# Patient Record
Sex: Female | Born: 1975 | Race: White | Hispanic: No | Marital: Single | Smoking: Former smoker
Health system: Southern US, Community
[De-identification: ages and names within clinical notes are randomized; demographics above are authoritative.]

## PROBLEM LIST (undated history)

## (undated) ENCOUNTER — Inpatient Hospital Stay (HOSPITAL_COMMUNITY): Payer: Self-pay

## (undated) DIAGNOSIS — L709 Acne, unspecified: Secondary | ICD-10-CM

## (undated) DIAGNOSIS — B069 Rubella without complication: Secondary | ICD-10-CM

## (undated) DIAGNOSIS — T7840XA Allergy, unspecified, initial encounter: Secondary | ICD-10-CM

## (undated) DIAGNOSIS — J329 Chronic sinusitis, unspecified: Secondary | ICD-10-CM

## (undated) HISTORY — PX: NO PAST SURGERIES: SHX2092

## (undated) HISTORY — PX: OTHER SURGICAL HISTORY: SHX169

## (undated) HISTORY — PX: WISDOM TOOTH EXTRACTION: SHX21

## (undated) HISTORY — PX: LEEP: SHX91

## (undated) HISTORY — DX: Rubella without complication: B06.9

## (undated) HISTORY — DX: Allergy, unspecified, initial encounter: T78.40XA

---

## 1997-12-03 ENCOUNTER — Observation Stay (HOSPITAL_COMMUNITY): Admission: AD | Admit: 1997-12-03 | Discharge: 1997-12-04 | Payer: Self-pay | Admitting: Obstetrics & Gynecology

## 1998-02-07 ENCOUNTER — Observation Stay (HOSPITAL_COMMUNITY): Admission: AD | Admit: 1998-02-07 | Discharge: 1998-02-08 | Payer: Self-pay | Admitting: Obstetrics & Gynecology

## 1998-03-01 ENCOUNTER — Inpatient Hospital Stay (HOSPITAL_COMMUNITY): Admission: AD | Admit: 1998-03-01 | Discharge: 1998-03-04 | Payer: Self-pay | Admitting: Obstetrics and Gynecology

## 1999-02-09 ENCOUNTER — Encounter: Admission: RE | Admit: 1999-02-09 | Discharge: 1999-05-10 | Payer: Self-pay

## 1999-05-11 ENCOUNTER — Encounter: Admission: RE | Admit: 1999-05-11 | Discharge: 1999-08-09 | Payer: Self-pay

## 1999-09-20 ENCOUNTER — Ambulatory Visit (HOSPITAL_COMMUNITY): Admission: RE | Admit: 1999-09-20 | Discharge: 1999-09-20 | Payer: Self-pay | Admitting: Neurosurgery

## 2000-12-31 ENCOUNTER — Other Ambulatory Visit: Admission: RE | Admit: 2000-12-31 | Discharge: 2000-12-31 | Payer: Self-pay | Admitting: Obstetrics and Gynecology

## 2001-07-29 ENCOUNTER — Inpatient Hospital Stay (HOSPITAL_COMMUNITY): Admission: AD | Admit: 2001-07-29 | Discharge: 2001-07-31 | Payer: Self-pay | Admitting: Obstetrics and Gynecology

## 2002-07-30 ENCOUNTER — Other Ambulatory Visit: Admission: RE | Admit: 2002-07-30 | Discharge: 2002-07-30 | Payer: Self-pay | Admitting: Obstetrics and Gynecology

## 2003-11-26 ENCOUNTER — Encounter: Admission: RE | Admit: 2003-11-26 | Discharge: 2003-11-26 | Payer: Self-pay | Admitting: Family Medicine

## 2003-11-26 ENCOUNTER — Ambulatory Visit (HOSPITAL_COMMUNITY): Admission: RE | Admit: 2003-11-26 | Discharge: 2003-11-26 | Payer: Self-pay | Admitting: Family Medicine

## 2012-01-24 ENCOUNTER — Encounter: Payer: Self-pay | Admitting: Obstetrics and Gynecology

## 2012-01-24 ENCOUNTER — Ambulatory Visit (INDEPENDENT_AMBULATORY_CARE_PROVIDER_SITE_OTHER): Payer: Self-pay | Admitting: Obstetrics and Gynecology

## 2012-01-24 VITALS — BP 92/64 | Temp 98.9°F | Wt 126.0 lb

## 2012-01-24 DIAGNOSIS — N871 Moderate cervical dysplasia: Secondary | ICD-10-CM | POA: Insufficient documentation

## 2012-01-24 DIAGNOSIS — N949 Unspecified condition associated with female genital organs and menstrual cycle: Secondary | ICD-10-CM

## 2012-01-24 DIAGNOSIS — R102 Pelvic and perineal pain: Secondary | ICD-10-CM

## 2012-01-24 DIAGNOSIS — Z309 Encounter for contraceptive management, unspecified: Secondary | ICD-10-CM

## 2012-01-24 DIAGNOSIS — Z30432 Encounter for removal of intrauterine contraceptive device: Secondary | ICD-10-CM

## 2012-01-24 DIAGNOSIS — F338 Other recurrent depressive disorders: Secondary | ICD-10-CM | POA: Insufficient documentation

## 2012-01-24 DIAGNOSIS — G43909 Migraine, unspecified, not intractable, without status migrainosus: Secondary | ICD-10-CM | POA: Insufficient documentation

## 2012-01-24 LAB — POCT URINALYSIS DIPSTICK
Bilirubin, UA: NEGATIVE
Glucose, UA: NEGATIVE
Ketones, UA: NEGATIVE
Nitrite, UA: NEGATIVE
Spec Grav, UA: >=1.03
Urobilinogen, UA: NEGATIVE
pH, UA: 5

## 2012-01-24 LAB — CBC
HCT: 43.2 % (ref 36.0–46.0)
Hemoglobin: 14.4 g/dL (ref 12.0–15.0)
MCH: 31 pg (ref 26.0–34.0)
MCHC: 33.3 g/dL (ref 30.0–36.0)
RDW: 13.4 % (ref 11.5–15.5)

## 2012-01-24 LAB — COMPREHENSIVE METABOLIC PANEL
AST: 16 U/L (ref 0–37)
Alkaline Phosphatase: 37 U/L — ABNORMAL LOW (ref 39–117)
BUN: 13 mg/dL (ref 6–23)
Creat: 0.67 mg/dL (ref 0.50–1.10)
Glucose, Bld: 86 mg/dL (ref 70–99)
Potassium: 4.7 mEq/L (ref 3.5–5.3)
Total Bilirubin: 0.5 mg/dL (ref 0.3–1.2)

## 2012-01-24 LAB — POCT URINE PREGNANCY: Preg Test, Ur: NEGATIVE

## 2012-01-24 NOTE — Patient Instructions (Signed)
You will be given a note to return to work once your ultrasound has been done and results reviewed Please keep your ultrasound appointment as scheduled.  If you cannot keep it, please call and reschedule.  If your pain does not improve with the pain medication given, or if your symptoms worsen, please call the office.

## 2012-01-24 NOTE — Progress Notes (Signed)
Patient with constant intense lower back pain radiating through to pelvic area since yesterday morning.  No relief with Ibuprofen 800mg  but decreased from 10/10 to 2/10 with Vicodin x 1 given by fiance'. Normal BM & urination, denies fever, dyspareunia, N/V but has begun to bleed (wipes blood).  Typically only has one spot monthly with Mirena. Denies heavy lifting, h/o renal stones, previous surgery on abdomen or trauma. Pt. does notice that pain is worse with sitting.  Lastly, patient wants Mirena removed.  O: Abdomen: soft, BS+, tender with guarding in RLQ more than LLQ but no rebound.  Pelvic:  EGBUS-wnl, vagina with moderate blood, cervix is non-tender without lesions with visible IUD strings-IUD removed without difficulty., uterus tender, normal size; adnexae tender right greater than left without any obvious palpable masses.  neg UPT & Chem 9  A: Pelvic Pain     Mirena Removal     Need for contraception  P: Pelvic ultrasound R/O pelvic masses      CBC, CMET-pending      Vicodin #30 1-2 q4 hours prn mod-severe pain      Pt. considering BCPs but smokes, so will accept          Micronor #1 po qd 11 rf; advised to use condoms x 1 month before depending on pills for contraception. Call office if symptoms worsen or don't respond to Vicodin

## 2012-01-25 ENCOUNTER — Other Ambulatory Visit: Payer: Self-pay | Admitting: Obstetrics and Gynecology

## 2012-01-25 ENCOUNTER — Encounter: Payer: Self-pay | Admitting: Obstetrics and Gynecology

## 2012-01-25 ENCOUNTER — Ambulatory Visit (INDEPENDENT_AMBULATORY_CARE_PROVIDER_SITE_OTHER): Payer: Self-pay | Admitting: Obstetrics and Gynecology

## 2012-01-25 ENCOUNTER — Ambulatory Visit (INDEPENDENT_AMBULATORY_CARE_PROVIDER_SITE_OTHER): Payer: Self-pay

## 2012-01-25 VITALS — BP 90/58 | HR 60 | Temp 98.2°F

## 2012-01-25 DIAGNOSIS — N949 Unspecified condition associated with female genital organs and menstrual cycle: Secondary | ICD-10-CM

## 2012-01-25 DIAGNOSIS — R102 Pelvic and perineal pain: Secondary | ICD-10-CM

## 2012-01-25 DIAGNOSIS — N83209 Unspecified ovarian cyst, unspecified side: Secondary | ICD-10-CM

## 2012-01-25 NOTE — Progress Notes (Signed)
Patient returned for follow up of pelvic pain with an ultrasound.  Patient continues to have pain.  Only takes one Vicodin which only takes the edge off.   Patient advised to take two tablets and may alternate with Ibuprofen 600 mg with food.  O:  U/S- right ovarian simple cyst 5.4 x 3.9 x 4.8 cm       uterus- 7.17 x 5.60 x 4.61 cm, left ovary-wnl  A: Right ovarian simple cyst  P:Torsion precautions     repeat ultrasound in 6 weeks     Continue Vicodin & Ibuprfen

## 2012-01-25 NOTE — Patient Instructions (Signed)
Call for sudden pain that does not improve quickly or temperature above 100 degrees

## 2012-04-17 ENCOUNTER — Encounter (HOSPITAL_BASED_OUTPATIENT_CLINIC_OR_DEPARTMENT_OTHER): Payer: Self-pay | Admitting: *Deleted

## 2012-04-17 ENCOUNTER — Emergency Department (HOSPITAL_BASED_OUTPATIENT_CLINIC_OR_DEPARTMENT_OTHER)
Admission: EM | Admit: 2012-04-17 | Discharge: 2012-04-17 | Disposition: A | Discharge status: Home / Self Care | Payer: Self-pay | Attending: Emergency Medicine | Admitting: Emergency Medicine

## 2012-04-17 DIAGNOSIS — F17200 Nicotine dependence, unspecified, uncomplicated: Secondary | ICD-10-CM | POA: Insufficient documentation

## 2012-04-17 DIAGNOSIS — Z882 Allergy status to sulfonamides status: Secondary | ICD-10-CM | POA: Insufficient documentation

## 2012-04-17 DIAGNOSIS — L255 Unspecified contact dermatitis due to plants, except food: Secondary | ICD-10-CM | POA: Insufficient documentation

## 2012-04-17 DIAGNOSIS — L237 Allergic contact dermatitis due to plants, except food: Secondary | ICD-10-CM

## 2012-04-17 DIAGNOSIS — T622X1A Toxic effect of other ingested (parts of) plant(s), accidental (unintentional), initial encounter: Secondary | ICD-10-CM | POA: Insufficient documentation

## 2012-04-17 MED ORDER — METHYLPREDNISOLONE SODIUM SUCC 125 MG IJ SOLR
125.0000 mg | Freq: Once | INTRAMUSCULAR | Status: AC
  Administered 2012-04-17: 125 mg via INTRAVENOUS
  Filled 2012-04-17: qty 2

## 2012-04-17 MED ORDER — PREDNISONE 10 MG PO TABS: ORAL_TABLET | ORAL | Status: DC

## 2012-04-17 NOTE — ED Provider Notes (Signed)
Medical screening examination/treatment/procedure(s) were performed by non-physician practitioner and as supervising physician I was immediately available for consultation/collaboration.   Leigh-Ann Lorn Butcher, MD 04/17/12 2349 

## 2012-04-17 NOTE — ED Provider Notes (Signed)
History     CSN: [id number]  Arrival date & time 04/17/12  7829   First MD Initiated Contact with Patient 04/17/12 1903      Chief Complaint  Patient presents with  . Rash    (Consider location/radiation/quality/duration/timing/severity/associated sxs/prior treatment) Patient is a 36 y.o. female presenting with rash. The history is provided by the patient. No language interpreter was used.  Rash  This is a new problem. The current episode started more than 2 days ago. The problem has been gradually worsening. The problem is associated with plant contact. There has been no fever. The pain is at a severity of 4/10. The pain is moderate. The pain has been constant since onset. Associated symptoms include itching. She has tried anti-itch cream and antihistamines for the symptoms.  Pt cleanedout brush and weeds now has a rash.  Pt here with daughter who has the same  Past Medical History  Diagnosis Date  . Allergy     Past Surgical History  Procedure Date  . Wisdom tooth extraction     No family history on file.  History  Substance Use Topics  . Smoking status: Current Everyday Smoker -- 10.0 packs/day    Types: Cigarettes  . Smokeless tobacco: Not on file   Comment: PT SMOKES 10 CIG. PER DAY  . Alcohol Use: No     DO NOT CURRENTLY DRINK    OB History    Grav Para Term Preterm Abortions TAB SAB Ect Mult Living                  Review of Systems  Skin: Positive for itching and rash.  All other systems reviewed and are negative.    Allergies  Sulfa antibiotics  Home Medications   Current Outpatient Rx  Name Route Sig Dispense Refill  . GOODY HEADACHE PO Oral Take 1 packet by mouth every 6 (six) hours as needed. For headache    . B COMPLEX PO TABS Oral Take 1 tablet by mouth daily.    . NORETHINDRONE 0.35 MG PO TABS Oral Take 1 tablet by mouth daily.    . TECNU OAK-N-IVY SKIN CLEANSER EX Apply externally Apply 1 application topically once as needed. For rash      . PREDNISONE 10 MG PO TABS  6,6,5,5,4,4,3,3,2,2,1,1 42 tablet 0    BP 99/67  Pulse 69  Temp 98.5 F (36.9 C) (Oral)  Resp 18  Ht 5' 5.5" (1.664 m)  Wt 124 lb (56.246 kg)  BMI 20.32 kg/m2  SpO2 100%  Physical Exam  Nursing note and vitals reviewed. Constitutional: She is oriented to person, place, and time. She appears well-developed and well-nourished.  HENT:  Head: Normocephalic.  Eyes: Pupils are equal, round, and reactive to light.  Cardiovascular: Normal rate.   Pulmonary/Chest: Effort normal.  Musculoskeletal: Normal range of motion.       Rash arms and neck  Neurological: She is alert and oriented to person, place, and time. She has normal reflexes.  Skin: Rash noted. There is erythema.  Psychiatric: She has a normal mood and affect.    ED Course  Procedures (including critical care time)  Labs Reviewed - No data to display No results found.   1. Poison ivy       MDM  Pt wants injection.  I gave solumedrol.  I will treat with steroid taper.          Lonia Skinner [place], [place] 04/17/12 1952

## 2012-04-17 NOTE — ED Notes (Signed)
Rash x 3 days after doing yard work.

## 2012-10-15 NOTE — L&D Delivery Note (Signed)
Delivery Note At 3:33 AM a viable female was a waterbirth delivered via Vaginal, Spontaneous Delivery (Presentation: LOA; nuchal X 1  ).  APGAR: 8 , 9 ; weight pending .   Placenta status: Intact, Spontaneous.  Cord: nuchal X1  with the following complications: none  Anesthesia:  none Episiotomy: none Lacerations: none Suture Repair: none Est. Blood Loss (mL):   Mom to postpartum.  Baby to nursery-stable.  [name] 07/15/2013, 4:03 AM

## 2012-10-24 ENCOUNTER — Encounter (HOSPITAL_BASED_OUTPATIENT_CLINIC_OR_DEPARTMENT_OTHER): Payer: Self-pay | Admitting: *Deleted

## 2012-10-24 ENCOUNTER — Emergency Department (HOSPITAL_BASED_OUTPATIENT_CLINIC_OR_DEPARTMENT_OTHER): Payer: Medicaid Other

## 2012-10-24 ENCOUNTER — Emergency Department (HOSPITAL_BASED_OUTPATIENT_CLINIC_OR_DEPARTMENT_OTHER)
Admission: EM | Admit: 2012-10-24 | Discharge: 2012-10-24 | Disposition: A | Discharge status: Home / Self Care | Payer: Medicaid Other | Attending: Emergency Medicine | Admitting: Emergency Medicine

## 2012-10-24 DIAGNOSIS — J3489 Other specified disorders of nose and nasal sinuses: Secondary | ICD-10-CM | POA: Insufficient documentation

## 2012-10-24 DIAGNOSIS — R0789 Other chest pain: Secondary | ICD-10-CM | POA: Insufficient documentation

## 2012-10-24 DIAGNOSIS — L0201 Cutaneous abscess of face: Secondary | ICD-10-CM | POA: Insufficient documentation

## 2012-10-24 DIAGNOSIS — L03211 Cellulitis of face: Secondary | ICD-10-CM | POA: Insufficient documentation

## 2012-10-24 DIAGNOSIS — R6883 Chills (without fever): Secondary | ICD-10-CM | POA: Insufficient documentation

## 2012-10-24 DIAGNOSIS — J40 Bronchitis, not specified as acute or chronic: Secondary | ICD-10-CM

## 2012-10-24 DIAGNOSIS — F17200 Nicotine dependence, unspecified, uncomplicated: Secondary | ICD-10-CM | POA: Insufficient documentation

## 2012-10-24 DIAGNOSIS — Z3202 Encounter for pregnancy test, result negative: Secondary | ICD-10-CM | POA: Insufficient documentation

## 2012-10-24 MED ORDER — AZITHROMYCIN 250 MG PO TABS: 250.0000 mg | ORAL_TABLET | Freq: Every day | ORAL | Status: DC

## 2012-10-24 NOTE — ED Notes (Signed)
Pt states she has been sick with fever chills cough congestion and body aches for 15 days will get better then start going downhill again no nausea vomiting or diarrhea

## 2012-10-24 NOTE — ED Provider Notes (Signed)
History     CSN: [id number]  Arrival date & time 10/24/12  1218   First MD Initiated Contact with Patient 10/24/12 1230      Chief Complaint  Patient presents with  . Cough  . Chills    (Consider location/radiation/quality/duration/timing/severity/associated sxs/prior treatment) Patient is a 37 y.o. female presenting with cough. The history is provided by the patient.  Cough This is a new problem. Episode onset: 15 days ago. The problem occurs constantly. The problem has not changed since onset.The cough is productive of sputum. There has been no fever. Associated symptoms include chest pain, chills, ear congestion, rhinorrhea and myalgias. Pertinent negatives include no shortness of breath and no wheezing. She has tried decongestants for the symptoms. The treatment provided no relief. She is a smoker. Her past medical history does not include bronchitis, COPD or asthma.    Past Medical History  Diagnosis Date  . Allergy     Past Surgical History  Procedure Date  . Wisdom tooth extraction     History reviewed. No pertinent family history.  History  Substance Use Topics  . Smoking status: Current Every Day Smoker -- 10.0 packs/day    Types: Cigarettes  . Smokeless tobacco: Not on file     Comment: PT SMOKES 10 CIG. PER DAY  . Alcohol Use: No     Comment: DO NOT CURRENTLY DRINK    OB History    Grav Para Term Preterm Abortions TAB SAB Ect Mult Living                  Review of Systems  Constitutional: Positive for chills.  HENT: Positive for rhinorrhea.   Respiratory: Positive for cough. Negative for shortness of breath and wheezing.   Cardiovascular: Positive for chest pain.  Musculoskeletal: Positive for myalgias.  Skin:       Also new painful, swollen pimple on the left chin that is getting worse over the last few days  All other systems reviewed and are negative.    Allergies  Sulfa antibiotics  Home Medications   Current Outpatient Rx  Name  Route   Sig  Dispense  Refill  . GOODY HEADACHE PO   Oral   Take 1 packet by mouth every 6 (six) hours as needed. For headache         . B COMPLEX PO TABS   Oral   Take 1 tablet by mouth daily.         . NORETHINDRONE 0.35 MG PO TABS   Oral   Take 1 tablet by mouth daily.         . TECNU OAK-N-IVY SKIN CLEANSER EX   Apply externally   Apply 1 application topically once as needed. For rash         . PREDNISONE 10 MG PO TABS      6,6,5,5,4,4,3,3,2,2,1,1   42 tablet   0     BP 106/63  Pulse 76  Temp 98.7 F (37.1 C) (Oral)  Resp 20  SpO2 99%  LMP 10/10/2012  Physical Exam  Nursing note and vitals reviewed. Constitutional: She is oriented to person, place, and time. She appears well-developed and well-nourished. No distress.  HENT:  Head: Normocephalic and atraumatic.    Nose: Mucosal edema and rhinorrhea present. Right sinus exhibits no maxillary sinus tenderness and no frontal sinus tenderness. Left sinus exhibits no maxillary sinus tenderness and no frontal sinus tenderness.  Mouth/Throat: Oropharynx is clear and moist and mucous membranes are normal.  Eyes: Conjunctivae normal and EOM are normal. Pupils are equal, round, and reactive to light.  Neck: Normal range of motion. Neck supple.  Cardiovascular: Normal rate, regular rhythm and intact distal pulses.   No murmur heard. Pulmonary/Chest: Effort normal and breath sounds normal. No respiratory distress. She has no wheezes. She has no rales.  Abdominal: Soft. She exhibits no distension. There is no tenderness. There is no rebound and no guarding.  Musculoskeletal: Normal range of motion. She exhibits no edema and no tenderness.  Neurological: She is alert and oriented to person, place, and time.  Skin: Skin is warm and dry. No rash noted.  Psychiatric: She has a normal mood and affect. Her behavior is normal.    ED Course  Procedures (including critical care time)   Labs Reviewed  PREGNANCY, URINE   No  results found.  INCISION AND DRAINAGE Performed by: Gwyneth Sprout Consent: Verbal consent obtained. Risks and benefits: risks, benefits and alternatives were discussed Type: abscess  Body area:  left chin  Anesthesia: local infiltration  Incision was made with a 18-gauge  Local anesthetic: lidocaine 1 % with epinephrine  Anesthetic total: 1 ml  Complexity: Simple   Drainage: purulent  Drainage amount: 1mL Packing material: None Patient tolerance: Patient tolerated the procedure well with no immediate complications.    1. Bronchitis   2. Facial abscess       MDM   Pt with symptoms consistent with viral URI and bronchitis like sx for the last 15 days without improvement.  Well appearing here.  No signs of breathing difficulty  No signs of pharyngitis, otitis or abnormal abdominal findings.   CXR wnl and pt to return with any further problems and will start azithromycin.  Secondly pt has abscess on her left chin that was I&D without surrounding cellulitis.         Gwyneth Sprout, MD 10/24/12 1344

## 2012-11-16 ENCOUNTER — Emergency Department (HOSPITAL_BASED_OUTPATIENT_CLINIC_OR_DEPARTMENT_OTHER)
Admission: EM | Admit: 2012-11-16 | Discharge: 2012-11-16 | Disposition: A | Discharge status: Home / Self Care | Payer: Medicaid Other | Attending: Emergency Medicine | Admitting: Emergency Medicine

## 2012-11-16 ENCOUNTER — Inpatient Hospital Stay (HOSPITAL_COMMUNITY): Payer: Medicaid Other

## 2012-11-16 ENCOUNTER — Inpatient Hospital Stay (EMERGENCY_DEPARTMENT_HOSPITAL)
Admission: AD | Admit: 2012-11-16 | Discharge: 2012-11-16 | Disposition: A | Discharge status: Home / Self Care | Payer: Medicaid Other | Source: Ambulatory Visit | Attending: Obstetrics & Gynecology | Admitting: Obstetrics & Gynecology

## 2012-11-16 ENCOUNTER — Encounter (HOSPITAL_COMMUNITY): Payer: Self-pay | Admitting: *Deleted

## 2012-11-16 ENCOUNTER — Encounter (HOSPITAL_BASED_OUTPATIENT_CLINIC_OR_DEPARTMENT_OTHER): Payer: Self-pay | Admitting: *Deleted

## 2012-11-16 DIAGNOSIS — R630 Anorexia: Secondary | ICD-10-CM | POA: Insufficient documentation

## 2012-11-16 DIAGNOSIS — R110 Nausea: Secondary | ICD-10-CM | POA: Insufficient documentation

## 2012-11-16 DIAGNOSIS — R1011 Right upper quadrant pain: Secondary | ICD-10-CM | POA: Insufficient documentation

## 2012-11-16 DIAGNOSIS — K5900 Constipation, unspecified: Secondary | ICD-10-CM | POA: Insufficient documentation

## 2012-11-16 DIAGNOSIS — R109 Unspecified abdominal pain: Secondary | ICD-10-CM

## 2012-11-16 DIAGNOSIS — Z3201 Encounter for pregnancy test, result positive: Secondary | ICD-10-CM | POA: Insufficient documentation

## 2012-11-16 DIAGNOSIS — O26899 Other specified pregnancy related conditions, unspecified trimester: Secondary | ICD-10-CM

## 2012-11-16 DIAGNOSIS — F17200 Nicotine dependence, unspecified, uncomplicated: Secondary | ICD-10-CM | POA: Insufficient documentation

## 2012-11-16 DIAGNOSIS — O99891 Other specified diseases and conditions complicating pregnancy: Secondary | ICD-10-CM | POA: Insufficient documentation

## 2012-11-16 DIAGNOSIS — Z8709 Personal history of other diseases of the respiratory system: Secondary | ICD-10-CM | POA: Insufficient documentation

## 2012-11-16 DIAGNOSIS — IMO0002 Reserved for concepts with insufficient information to code with codable children: Secondary | ICD-10-CM | POA: Insufficient documentation

## 2012-11-16 DIAGNOSIS — R197 Diarrhea, unspecified: Secondary | ICD-10-CM | POA: Insufficient documentation

## 2012-11-16 DIAGNOSIS — R3915 Urgency of urination: Secondary | ICD-10-CM | POA: Insufficient documentation

## 2012-11-16 LAB — COMPREHENSIVE METABOLIC PANEL
AST: 21 U/L (ref 0–37)
BUN: 11 mg/dL (ref 6–23)
CO2: 22 mEq/L (ref 19–32)
Chloride: 102 mEq/L (ref 96–112)
Creatinine, Ser: 0.6 mg/dL (ref 0.50–1.10)
GFR calc non Af Amer: >90 mL/min (ref >90)
Total Bilirubin: 0.2 mg/dL — ABNORMAL LOW (ref 0.3–1.2)

## 2012-11-16 LAB — URINALYSIS, ROUTINE W REFLEX MICROSCOPIC
Bilirubin Urine: NEGATIVE
Hgb urine dipstick: NEGATIVE
Specific Gravity, Urine: 1.025 (ref 1.005–1.030)
pH: 6 (ref 5.0–8.0)

## 2012-11-16 LAB — CBC WITH DIFFERENTIAL/PLATELET
Eosinophils Relative: 2 % (ref 0–5)
HCT: 39 % (ref 36.0–46.0)
Lymphocytes Relative: 27 % (ref 12–46)
Lymphs Abs: 2 10*3/uL (ref 0.7–4.0)
MCV: 89 fL (ref 78.0–100.0)
Monocytes Absolute: 0.6 10*3/uL (ref 0.1–1.0)
RBC: 4.38 MIL/uL (ref 3.87–5.11)
WBC: 7.4 10*3/uL (ref 4.0–10.5)

## 2012-11-16 LAB — HCG, QUANTITATIVE, PREGNANCY: hCG, Beta Chain, Quant, S: 15106 m[IU]/mL — ABNORMAL HIGH (ref <5)

## 2012-11-16 MED ORDER — ONDANSETRON HCL 4 MG/2ML IJ SOLN
4.0000 mg | Freq: Once | INTRAMUSCULAR | Status: AC
  Administered 2012-11-16: 4 mg via INTRAVENOUS
  Filled 2012-11-16: qty 2

## 2012-11-16 MED ORDER — SODIUM CHLORIDE 0.9 % IV BOLUS (SEPSIS)
1000.0000 mL | Freq: Once | INTRAVENOUS | Status: AC
  Administered 2012-11-16: 1000 mL via INTRAVENOUS

## 2012-11-16 NOTE — MAU Note (Signed)
Thressa Sheller CNM in Triage to discuss u/s results and d/c plan with pt. Written and verbal d/c instructions given and understanding voiced.

## 2012-11-16 NOTE — MAU Note (Signed)
Pt came over from [facility]. Positive upt and had quant. Here for u/s.

## 2012-11-16 NOTE — ED Notes (Signed)
C/o mid abd pain that started one week ago but worse today. Describes as cramping type pain that is constant. Last BM was 2-1. C/o nausea and denies vomiting. Denies fevers. C/o urinary frequency.

## 2012-11-16 NOTE — MAU Provider Note (Signed)
  History     CSN: [id number]  Arrival date and time: 11/16/12 0305   None     Chief Complaint  Patient presents with  . Abdominal Pain   HPI  Pt was sent over from [facility] for ultrasound. She was seen there, and 2/2 RUQ and abdominal pain. She had a BHCG 15,000+. Unable to doppler FHT, and patient was sent here for [place] evaluation.   Past Medical History  Diagnosis Date  . Allergy     Past Surgical History  Procedure Date  . Wisdom tooth extraction     No family history on file.  History  Substance Use Topics  . Smoking status: Current Every Day Smoker -- 10.0 packs/day    Types: Cigarettes  . Smokeless tobacco: Not on file     Comment: PT SMOKES 10 CIG. PER DAY  . Alcohol Use: Yes     Comment: social     Allergies:  Allergies  Allergen Reactions  . Sulfa Antibiotics Other (See Comments)    headache    Prescriptions prior to admission  Medication Sig Dispense Refill  . Aspirin-Acetaminophen-Caffeine (GOODY HEADACHE PO) Take 1 packet by mouth every 6 (six) hours as needed. For headache      . b complex vitamins tablet Take 1 tablet by mouth daily.      . norethindrone (MICRONOR,CAMILA,ERRIN) 0.35 MG tablet Take 1 tablet by mouth daily.      . Poison Ivy Treatments (TECNU OAK-N-IVY SKIN CLEANSER EX) Apply 1 application topically once as needed. For rash        ROS Physical Exam   Blood pressure 97/58, pulse 81, temperature 98.7 F (37.1 C), resp. rate 20, height 5' 5.5" (1.664 m), weight 142 lb 3.2 oz (64.501 kg), last menstrual period 10/10/2012.  Physical Exam  MAU Course  Procedures  *RADIOLOGY REPORT*  Clinical Data: Pelvic pain. Quantitative beta HCG 15,106. Last  menstrual period is 10/10/2012.  OBSTETRIC <14 WK [place] AND TRANSVAGINAL OB US  Technique: Both transabdominal and transvaginal ultrasound  examinations were performed for complete evaluation of the  gestation as well as the maternal uterus, adnexal regions, and  pelvic  cul-de-sac. Transvaginal technique was performed to assess  early pregnancy.  Comparison: None.  Intrauterine gestational sac: A single intrauterine gestational  sac is visualized with regular contours.  Yolk sac: The yolk sac is visualized.  Embryo: The fetal pole is visualized.  Cardiac Activity: Fetal cardiac activity is demonstrated.  Heart Rate: 103 bpm  MSD: 1.51 cm 6 w 3 d [place] EDC: 07/09/2013  CRL: 1.7 mm, too small to register for estimated gestational age.  Maternal uterus/adnexae:  No myometrial mass or subchorionic hemorrhage identified. The  right ovary measures 4.1 x 1.6 x 2.5 cm. Normal follicular  changes. The left ovary measures 5 x 3.1 x 2.9 cm. Involuting  corpus luteum cyst is present. Flow is demonstrated in both  ovaries on color flow Doppler imaging. No free pelvic fluid  collections.  IMPRESSION:  Single intrauterine pregnancy with estimated gestational age by  mean sac diameter of 6 weeks 3 days.  Original Report Authenticated By: Burman Nieves, M.D.  Assessment and Plan   1. Abdominal pain in pregnancy    First trimester danger signs reviewed Start prenatal care as soon as possible  [name] 11/16/2012, 3:36 AM

## 2012-11-16 NOTE — ED Provider Notes (Addendum)
History     CSN: [id number]  Arrival date & time 11/16/12  0005   First MD Initiated Contact with Patient 11/16/12 0021      Chief Complaint  Patient presents with  . Abdominal Pain    (Consider location/radiation/quality/duration/timing/severity/associated sxs/prior treatment) HPI Pt reports about a week of cramping, gnawing abdominal pain, comes and goes, associated with nausea and decreased appetite. She has had irregular bowels with bouts of constipation and diarrhea. Denies any fever, no blood in stool. Pain is mostly in epigastric and RUQ. No prior history of same. No dysuria, but some urinary urgency. No vaginal bleeding or discharge.    Past Medical History  Diagnosis Date  . Allergy     Past Surgical History  Procedure Date  . Wisdom tooth extraction     No family history on file.  History  Substance Use Topics  . Smoking status: Current Every Day Smoker -- 10.0 packs/day    Types: Cigarettes  . Smokeless tobacco: Not on file     Comment: PT SMOKES 10 CIG. PER DAY  . Alcohol Use: Yes     Comment: social     OB History    Grav Para Term Preterm Abortions TAB SAB Ect Mult Living                  Review of Systems All other systems reviewed and are negative except as noted in HPI.   Allergies  Sulfa antibiotics  Home Medications   Current Outpatient Rx  Name  Route  Sig  Dispense  Refill  . GOODY HEADACHE PO   Oral   Take 1 packet by mouth every 6 (six) hours as needed. For headache         . AZITHROMYCIN 250 MG PO TABS   Oral   Take 1 tablet (250 mg total) by mouth daily. Take first 2 tablets together, then 1 every day until finished.   6 tablet   0   . B COMPLEX PO TABS   Oral   Take 1 tablet by mouth daily.         . NORETHINDRONE 0.35 MG PO TABS   Oral   Take 1 tablet by mouth daily.         . TECNU OAK-N-IVY SKIN CLEANSER EX   Apply externally   Apply 1 application topically once as needed. For rash         . PREDNISONE  10 MG PO TABS      6,6,5,5,4,4,3,3,2,2,1,1   42 tablet   0     BP 104/71  Pulse 72  Temp 98.2 F (36.8 C) (Oral)  Resp 18  Ht 5' 5.5" (1.664 m)  Wt 135 lb (61.236 kg)  BMI 22.12 kg/m2  SpO2 100%  LMP 10/10/2012  Physical Exam  Nursing note and vitals reviewed. Constitutional: She is oriented to person, place, and time. She appears well-developed and well-nourished.  HENT:  Head: Normocephalic and atraumatic.  Eyes: EOM are normal. Pupils are equal, round, and reactive to light.  Neck: Normal range of motion. Neck supple.  Cardiovascular: Normal rate, normal heart sounds and intact distal pulses.   Pulmonary/Chest: Effort normal and breath sounds normal.  Abdominal: Bowel sounds are normal. She exhibits no distension. There is tenderness (RUQ, mild). There is no rebound and no guarding.  Musculoskeletal: Normal range of motion. She exhibits no edema and no tenderness.  Neurological: She is alert and oriented to person, place, and time. She has  normal strength. No cranial nerve deficit or sensory deficit.  Skin: Skin is warm and dry. No rash noted.  Psychiatric: She has a normal mood and affect.    ED Course  Procedures (including critical care time)  Labs Reviewed  PREGNANCY, URINE - Abnormal; Notable for the following:    Preg Test, Ur POSITIVE (*)     All other components within normal limits  COMPREHENSIVE METABOLIC PANEL - Abnormal; Notable for the following:    Alkaline Phosphatase 34 (*)     Total Bilirubin 0.2 (*)     All other components within normal limits  HCG, QUANTITATIVE, PREGNANCY - Abnormal; Notable for the following:    hCG, Beta Chain, Quant, S 15106 (*)     All other components within normal limits  URINALYSIS, ROUTINE W REFLEX MICROSCOPIC  CBC WITH DIFFERENTIAL  LIPASE, BLOOD   No results found.   No diagnosis found.    MDM  Urine HCG is positive. Pt states has been on mini-pill. She was here for bronchitis about 3 weeks ago and hcg neg  at that time. LMP was end of December. Will confirm with serum quant. No pelvic pain or tenderness and no vaginal complaints. Will also eval gall bladder.   2:15 AM Quant confirms pregnancy. No ultrasound available here at this hour. Advised to go directly to MAU for further eval and to rule out ectopic. Pt agrees and will go there directly from here. She is safe to drive herself. Daughter is here with her as well.    Spoke with Herbert Seta, midwife at [facility]U to let her know the patient will be coming there. She asked that I check FHT given relatively high quant for reported gestational age. I was unable to find any fetal heart tones.   Algie Westry B. Bernette Mayers, MD 11/16/12 [phone]

## 2012-11-19 ENCOUNTER — Encounter (HOSPITAL_COMMUNITY): Payer: Self-pay

## 2012-11-19 ENCOUNTER — Inpatient Hospital Stay (HOSPITAL_COMMUNITY)
Admission: AD | Admit: 2012-11-19 | Discharge: 2012-11-19 | Disposition: A | Discharge status: Home / Self Care | Payer: Medicaid Other | Source: Ambulatory Visit | Attending: Obstetrics and Gynecology | Admitting: Obstetrics and Gynecology

## 2012-11-19 DIAGNOSIS — O209 Hemorrhage in early pregnancy, unspecified: Secondary | ICD-10-CM

## 2012-11-19 LAB — WET PREP, GENITAL: Yeast Wet Prep HPF POC: NONE SEEN

## 2012-11-19 NOTE — MAU Note (Signed)
Just found out preg last Friday, has been having pain.  Bleeding started this morning, ? Passed something- not a blood clot. Has been watery.

## 2012-11-19 NOTE — MAU Provider Note (Signed)
History     CSN: [id number]  Arrival date and time: 11/19/12 1233   First Provider Initiated Contact with Patient 11/19/12 1322      Chief Complaint  Patient presents with  . Vaginal Bleeding   HPI Ms. Virginia Sawyer is a 37 y.o. [id number] at [redacted]w[redacted]d who presents to MAU today with complaint of vaginal bleeding. The patient states that this started earlier today. She states that it seems to be thin and watery with small clots. She is having mild lower abdominal cramping similar to when she was evaluated previously. The patient has had some nausea without vomiting. She is fatigued, but has no fever. She also states that she had intercourse yesterday in the morning. She has had issues with alternating constipation and diarrhea x 1 month which has also caused her to have some lower abdominal cramping.   OB History    Grav Para Term Preterm Abortions TAB SAB Ect Mult Living   5 3 3  1  1   3       Past Medical History  Diagnosis Date  . Allergy     Past Surgical History  Procedure Date  . Wisdom tooth extraction   . No past surgeries   . Lasix eye surgery     age 44    No family history on file.  History  Substance Use Topics  . Smoking status: Current Every Day Smoker -- 10.0 packs/day    Types: Cigarettes  . Smokeless tobacco: Not on file     Comment: PT SMOKES 10 CIG. PER DAY  . Alcohol Use: Yes     Comment: social     Allergies:  Allergies  Allergen Reactions  . Sulfa Antibiotics Other (See Comments)    headache    Prescriptions prior to admission  Medication Sig Dispense Refill  . b complex vitamins tablet Take 1 tablet by mouth daily.      . minocycline (DYNACIN) 100 MG tablet Take 100 mg by mouth 2 (two) times daily.        Review of Systems  Constitutional: Negative for fever and chills.  Gastrointestinal: Positive for nausea, abdominal pain, diarrhea and constipation. Negative for vomiting and blood in stool.  Genitourinary: Negative for dysuria,  urgency and frequency.       + vaginal bleeding + mucus discharge  No odor, color   Physical Exam   Blood pressure 105/62, pulse 76, temperature 98.3 F (36.8 C), temperature source Oral, resp. rate 18, height 5\' 5"  (1.651 m), weight 141 lb (63.957 kg), last menstrual period 10/10/2012.  Physical Exam  Constitutional: She is oriented to person, place, and time. She appears well-developed and well-nourished. No distress.  HENT:  Head: Normocephalic.  Cardiovascular: Normal rate.   Respiratory: Effort normal.  GI: Soft. She exhibits no distension and no mass. There is tenderness (mild tenderness to the suprapubic region at the midline). There is no rebound and no guarding.  Genitourinary: Vagina normal. Uterus is tender (mild tenderness to palpation). Uterus is not enlarged. Cervix exhibits discharge (mucus discharge with scant amount of dark blood noted) and friability. Cervix exhibits no motion tenderness. Right adnexum displays no mass and no tenderness. Left adnexum displays no mass and no tenderness.  Neurological: She is alert and oriented to person, place, and time.  Skin: Skin is warm and dry. No erythema.  Psychiatric: Her mood appears anxious.   Results for orders placed during the hospital encounter of 11/19/12 (from the past 24 hour(s))  WET PREP, GENITAL     Status: Abnormal   Collection Time   11/19/12  2:00 PM      Component Value Range   Yeast Wet Prep HPF POC NONE SEEN  NONE SEEN   Trich, Wet Prep NONE SEEN  NONE SEEN   Clue Cells Wet Prep HPF POC NONE SEEN  NONE SEEN   WBC, Wet Prep HPF POC FEW (*) NONE SEEN    MAU Course  Procedures None  MDM Discussed patient with Dr. Jolayne Panther. She does not feel that the exam warrants [place] at this time considering patient had confirmed IUP on [place] 3 days ago and bleeding/cramping are minimal.   Assessment and Plan  A: IUP 6w 6d Vaginal bleeding in pregnancy  P: Discharge home Discussed causes of spotting in early  pregnancy Bleeding precautions discussed Patient encouraged to increase PO hydration for cramping and constipation Patient may take Tylenol as needed for cramping and discomfort Referral sent to [facility] for patient to start care around 10-[redacted] weeks GA Pregnancy confirmation letter given with Medicaid Application Assistance contact numbers Patient may return to MAU as needed or if her condition were to change or worsen  Freddi Starr, PA-C 11/19/2012, 2:31 PM

## 2012-11-20 LAB — GC/CHLAMYDIA PROBE AMP: CT Probe RNA: NEGATIVE

## 2012-11-20 NOTE — [facility]U Provider Note (Signed)
Attestation of Attending Supervision of Advanced Practitioner (CNM/NP): Evaluation and management procedures were performed by the Advanced Practitioner under my supervision and collaboration.  I have reviewed the Advanced Practitioner's note and chart, and I agree with the management and plan.  [place] 11/20/2012 10:19 AM   

## 2012-12-31 ENCOUNTER — Encounter: Payer: Self-pay | Admitting: Family Medicine

## 2012-12-31 ENCOUNTER — Other Ambulatory Visit (HOSPITAL_COMMUNITY)
Admission: RE | Admit: 2012-12-31 | Discharge: 2012-12-31 | Disposition: A | Discharge status: Home / Self Care | Payer: Medicaid Other | Source: Ambulatory Visit | Attending: Family Medicine | Admitting: Family Medicine

## 2012-12-31 ENCOUNTER — Ambulatory Visit (INDEPENDENT_AMBULATORY_CARE_PROVIDER_SITE_OTHER): Payer: Medicaid Other | Admitting: Family Medicine

## 2012-12-31 VITALS — BP 102/65 | Temp 97.6°F | Wt 148.1 lb

## 2012-12-31 DIAGNOSIS — Z113 Encounter for screening for infections with a predominantly sexual mode of transmission: Secondary | ICD-10-CM | POA: Insufficient documentation

## 2012-12-31 DIAGNOSIS — Z01419 Encounter for gynecological examination (general) (routine) without abnormal findings: Secondary | ICD-10-CM | POA: Insufficient documentation

## 2012-12-31 DIAGNOSIS — Z1151 Encounter for screening for human papillomavirus (HPV): Secondary | ICD-10-CM | POA: Insufficient documentation

## 2012-12-31 DIAGNOSIS — Z3490 Encounter for supervision of normal pregnancy, unspecified, unspecified trimester: Secondary | ICD-10-CM | POA: Insufficient documentation

## 2012-12-31 DIAGNOSIS — L709 Acne, unspecified: Secondary | ICD-10-CM | POA: Insufficient documentation

## 2012-12-31 DIAGNOSIS — Z3480 Encounter for supervision of other normal pregnancy, unspecified trimester: Secondary | ICD-10-CM

## 2012-12-31 DIAGNOSIS — R87810 Cervical high risk human papillomavirus (HPV) DNA test positive: Secondary | ICD-10-CM | POA: Insufficient documentation

## 2012-12-31 DIAGNOSIS — L708 Other acne: Secondary | ICD-10-CM

## 2012-12-31 DIAGNOSIS — K5900 Constipation, unspecified: Secondary | ICD-10-CM

## 2012-12-31 DIAGNOSIS — Z3492 Encounter for supervision of normal pregnancy, unspecified, second trimester: Secondary | ICD-10-CM

## 2012-12-31 LAB — POCT URINALYSIS DIP (DEVICE)
Bilirubin Urine: NEGATIVE
Ketones, ur: NEGATIVE mg/dL
pH: 6 (ref 5.0–8.0)

## 2012-12-31 NOTE — Progress Notes (Signed)
Subjective:    Virginia Sawyer is being seen today for her first obstetrical visit.  This is not a planned pregnancy. She was on the minipill and became pregnant. She is at [redacted]w[redacted]d gestation based on [redacted]w[redacted]d ultrasound (gestational sac size and CRL), not consistent with her LMP (off by 8 days). Her obstetrical history is significant for advanced maternal age, smoking (quit in early January when became pregnancy), hx LGA baby (9 lb).  Patient does intend to breast feed. Pregnancy history fully reviewed.   Denies complications with previous pregnancies or deliveries. All vaginal deliveries. However, second delivery vacuum assisted, 8 lb, 7oz. Subsequently delivered a 9 lb baby with no problems.  Has had some vaginal bleeding 3 or 4 times to date - describes as watery pink discharge, not enough to fill up a pad. Not currently bleeding today. Possible history of chlamydia, not sure. Last pap a few years ago, states had abnormal pap when she was 15 but none since.  Denies cramping/contractions, bleeding, vaginal discharge, nausea or vomiting. Does c/o constipation and some nausea. Was taking MiraLax but felt it just made her bloated. Now on stool softener and occasional laxative. Still having problems. Taking gummy vitamin with fiber and folic acid supplement. Patient has been on minocycline for acne. Would like dermatology referral.  History of [place] measles but states she tests negative for Rubella antibodies.  LMP 12/27:  Just after Christmas. But not completely sure. Had IUD but got a cyst and had the IUD removed a year ago. Has been on minipill since then (not placed on combined OCP due to smoking and age per pt).   Menstrual History: OB History   Grav Para Term Preterm Abortions TAB SAB Ect Mult Living   5 3 3  1  1   3       Patient's last menstrual period was 10/10/2012.   Social:  Former smoker, quit early in this pregnancy. Occasional alcohol, got drunk on New Year's Eve but no alcohol since  she found out she was pregnant. No drug use.  The following portions of the patient's history were reviewed and updated as appropriate: allergies, current medications, past family history, past medical history, past social history, past surgical history and problem list.  Review of Systems Pertinent items are noted in HPI.    Objective:    BP 102/65  Temp(Src) 97.6 F (36.4 C)  Wt 148 lb 1.6 oz (67.178 kg)  BMI 24.65 kg/m2  LMP 10/10/2012 General appearance: alert, cooperative and no distress Head: Normocephalic, without obvious abnormality, atraumatic Neck: no adenopathy, supple, symmetrical, trachea midline and thyroid not enlarged, symmetric, no tenderness/mass/nodules Lungs: clear to auscultation bilaterally Heart: regular rate and rhythm, S1, S2 normal, no murmur, click, rub or gallop Abdomen: soft, non-tender, normal bowel sounds Pelvic: cervix normal in appearance, external genitalia normal, no adnexal masses or tenderness, no cervical motion tenderness, vagina normal without discharge and some spotting after pap Extremities: extremities normal, atraumatic, no cyanosis or edema Neurologic: Grossly normal    Assessment:    Pregnancy at 12 and 6/7 weeks    Plan:    Initial labs drawn. Prenatal vitamins. Problem list reviewed and updated. AFP3 discussed: declined. Role of ultrasound in pregnancy discussed; fetal survey: requested. Amniocentesis discussed: not indicated. Constipation:  Recommend fiber suplement (metamucil) and continue colace. Acne:  Stop minocycline. Referral to derm per patient request. Follow up in 4 weeks. 50% of 45 min visit spent on counseling and coordination of care.

## 2012-12-31 NOTE — Progress Notes (Signed)
Pulse- 90  Pain-"constipation"   Went to [facility] in January and found out was pregnant.  Has had x 3 vaginal bleeding twice after intercourse Weight gain of 25-35lbs

## 2012-12-31 NOTE — Patient Instructions (Signed)
Pregnancy - Second Trimester The second trimester of pregnancy (3 to 6 months) is a period of rapid growth for you and your baby. At the end of the sixth month, your baby is about 9 inches long and weighs 1 1/2 pounds. You will begin to feel the baby move between 18 and 20 weeks of the pregnancy. This is called quickening. Weight gain is faster. A clear fluid (colostrum) may leak out of your breasts. You may feel small contractions of the womb (uterus). This is known as false labor or Braxton-Hicks contractions. This is like a practice for labor when the baby is ready to be born. Usually, the problems with morning sickness have usually passed by the end of your first trimester. Some women develop small dark blotches (called cholasma, mask of pregnancy) on their face that usually goes away after the baby is born. Exposure to the sun makes the blotches worse. Acne may also develop in some pregnant women and pregnant women who have acne, may find that it goes away. PRENATAL EXAMS  Blood work may continue to be done during prenatal exams. These tests are done to check on your health and the probable health of your baby. Blood work is used to follow your blood levels (hemoglobin). Anemia (low hemoglobin) is common during pregnancy. Iron and vitamins are given to help prevent this. You will also be checked for diabetes between 24 and 28 weeks of the pregnancy. Some of the previous blood tests may be repeated.  The size of the uterus is measured during each visit. This is to make sure that the baby is continuing to grow properly according to the dates of the pregnancy.  Your blood pressure is checked every prenatal visit. This is to make sure you are not getting toxemia.  Your urine is checked to make sure you do not have an infection, diabetes or protein in the urine.  Your weight is checked often to make sure gains are happening at the suggested rate. This is to ensure that both you and your baby are growing  normally.  Sometimes, an ultrasound is performed to confirm the proper growth and development of the baby. This is a test which bounces harmless sound waves off the baby so your caregiver can more accurately determine due dates. Sometimes, a specialized test is done on the amniotic fluid surrounding the baby. This test is called an amniocentesis. The amniotic fluid is obtained by sticking a needle into the belly (abdomen). This is done to check the chromosomes in instances where there is a concern about possible genetic problems with the baby. It is also sometimes done near the end of pregnancy if an early delivery is required. In this case, it is done to help make sure the baby's lungs are mature enough for the baby to live outside of the womb. CHANGES OCCURING IN THE SECOND TRIMESTER OF PREGNANCY Your body goes through many changes during pregnancy. They vary from person to person. Talk to your caregiver about changes you notice that you are concerned about.  During the second trimester, you will likely have an increase in your appetite. It is normal to have cravings for certain foods. This varies from person to person and pregnancy to pregnancy.  Your lower abdomen will begin to bulge.  You may have to urinate more often because the uterus and baby are pressing on your bladder. It is also common to get more bladder infections during pregnancy (pain with urination). You can help this by   drinking lots of fluids and emptying your bladder before and after intercourse.  You may begin to get stretch marks on your hips, abdomen, and breasts. These are normal changes in the body during pregnancy. There are no exercises or medications to take that prevent this change.  You may begin to develop swollen and bulging veins (varicose veins) in your legs. Wearing support hose, elevating your feet for 15 minutes, 3 to 4 times a day and limiting salt in your diet helps lessen the problem.  Heartburn may develop  as the uterus grows and pushes up against the stomach. Antacids recommended by your caregiver helps with this problem. Also, eating smaller meals 4 to 5 times a day helps.  Constipation can be treated with a stool softener or adding bulk to your diet. Drinking lots of fluids, vegetables, fruits, and whole grains are helpful.  Exercising is also helpful. If you have been very active up until your pregnancy, most of these activities can be continued during your pregnancy. If you have been less active, it is helpful to start an exercise program such as walking.  Hemorrhoids (varicose veins in the rectum) may develop at the end of the second trimester. Warm sitz baths and hemorrhoid cream recommended by your caregiver helps hemorrhoid problems.  Backaches may develop during this time of your pregnancy. Avoid heavy lifting, wear low heal shoes and practice good posture to help with backache problems.  Some pregnant women develop tingling and numbness of their hand and fingers because of swelling and tightening of ligaments in the wrist (carpel tunnel syndrome). This goes away after the baby is born.  As your breasts enlarge, you may have to get a bigger bra. Get a comfortable, cotton, support bra. Do not get a nursing bra until the last month of the pregnancy if you will be nursing the baby.  You may get a dark line from your belly button to the pubic area called the linea nigra.  You may develop rosy cheeks because of increase blood flow to the face.  You may develop spider looking lines of the face, neck, arms and chest. These go away after the baby is born. HOME CARE INSTRUCTIONS   It is extremely important to avoid all smoking, herbs, alcohol, and unprescribed drugs during your pregnancy. These chemicals affect the formation and growth of the baby. Avoid these chemicals throughout the pregnancy to ensure the delivery of a healthy infant.  Most of your home care instructions are the same as  suggested for the first trimester of your pregnancy. Keep your caregiver's appointments. Follow your caregiver's instructions regarding medication use, exercise and diet.  During pregnancy, you are providing food for you and your baby. Continue to eat regular, well-balanced meals. Choose foods such as meat, fish, milk and other low fat dairy products, vegetables, fruits, and whole-grain breads and cereals. Your caregiver will tell you of the ideal weight gain.  A physical sexual relationship may be continued up until near the end of pregnancy if there are no other problems. Problems could include early (premature) leaking of amniotic fluid from the membranes, vaginal bleeding, abdominal pain, or other medical or pregnancy problems.  Exercise regularly if there are no restrictions. Check with your caregiver if you are unsure of the safety of some of your exercises. The greatest weight gain will occur in the last 2 trimesters of pregnancy. Exercise will help you:  Control your weight.  Get you in shape for labor and delivery.  Lose weight   after you have the baby.  Wear a good support or jogging bra for breast tenderness during pregnancy. This may help if worn during sleep. Pads or tissues may be used in the bra if you are leaking colostrum.  Do not use hot tubs, steam rooms or saunas throughout the pregnancy.  Wear your seat belt at all times when driving. This protects you and your baby if you are in an accident.  Avoid raw meat, uncooked cheese, cat litter boxes and soil used by cats. These carry germs that can cause birth defects in the baby.  The second trimester is also a good time to visit your dentist for your dental health if this has not been done yet. Getting your teeth cleaned is OK. Use a soft toothbrush. Brush gently during pregnancy.  It is easier to loose urine during pregnancy. Tightening up and strengthening the pelvic muscles will help with this problem. Practice stopping your  urination while you are going to the bathroom. These are the same muscles you need to strengthen. It is also the muscles you would use as if you were trying to stop from passing gas. You can practice tightening these muscles up 10 times a set and repeating this about 3 times per day. Once you know what muscles to tighten up, do not perform these exercises during urination. It is more likely to contribute to an infection by backing up the urine.  Ask for help if you have financial, counseling or nutritional needs during pregnancy. Your caregiver will be able to offer counseling for these needs as well as refer you for other special needs.  Your skin may become oily. If so, wash your face with mild soap, use non-greasy moisturizer and oil or cream based makeup. MEDICATIONS AND DRUG USE IN PREGNANCY  Take prenatal vitamins as directed. The vitamin should contain 1 milligram of folic acid. Keep all vitamins out of reach of children. Only a couple vitamins or tablets containing iron may be fatal to a baby or young child when ingested.  Avoid use of all medications, including herbs, over-the-counter medications, not prescribed or suggested by your caregiver. Only take over-the-counter or prescription medicines for pain, discomfort, or fever as directed by your caregiver. Do not use aspirin.  Let your caregiver also know about herbs you may be using.  Alcohol is related to a number of birth defects. This includes fetal alcohol syndrome. All alcohol, in any form, should be avoided completely. Smoking will cause low birth rate and premature babies.  Street or illegal drugs are very harmful to the baby. They are absolutely forbidden. A baby born to an addicted mother will be addicted at birth. The baby will go through the same withdrawal an adult does. SEEK MEDICAL CARE IF:  You have any concerns or worries during your pregnancy. It is better to call with your questions if you feel they cannot wait, rather  than worry about them. SEEK IMMEDIATE MEDICAL CARE IF:   An unexplained oral temperature above 102 F (38.9 C) develops, or as your caregiver suggests.  You have leaking of fluid from the vagina (birth canal). If leaking membranes are suspected, take your temperature and tell your caregiver of this when you call.  There is vaginal spotting, bleeding, or passing clots. Tell your caregiver of the amount and how many pads are used. Light spotting in pregnancy is common, especially following intercourse.  You develop a bad smelling vaginal discharge with a change in the color from clear   to white.  You continue to feel sick to your stomach (nauseated) and have no relief from remedies suggested. You vomit blood or coffee ground-like materials.  You lose more than 2 pounds of weight or gain more than 2 pounds of weight over 1 week, or as suggested by your caregiver.  You notice swelling of your face, hands, feet, or legs.  You get exposed to [place] measles and have never had them.  You are exposed to fifth disease or chickenpox.  You develop belly (abdominal) pain. Round ligament discomfort is a common non-cancerous (benign) cause of abdominal pain in pregnancy. Your caregiver still must evaluate you.  You develop a bad headache that does not go away.  You develop fever, diarrhea, pain with urination, or shortness of breath.  You develop visual problems, blurry, or double vision.  You fall or are in a car accident or any kind of trauma.  There is mental or physical violence at home. Document Released: 09/25/2001 Document Revised: 12/24/2011 Document Reviewed: 03/30/2009 Exit[employer] Patient Information 2013 [employer].  

## 2013-01-01 ENCOUNTER — Telehealth: Payer: Self-pay | Admitting: Family Medicine

## 2013-01-01 LAB — OBSTETRIC PANEL
Antibody Screen: NEGATIVE
Basophils Relative: 0 % (ref 0–1)
HCT: 36.3 % (ref 36.0–46.0)
Hemoglobin: 12.5 g/dL (ref 12.0–15.0)
Lymphocytes Relative: 23 % (ref 12–46)
Lymphs Abs: 1.7 10*3/uL (ref 0.7–4.0)
MCHC: 34.4 g/dL (ref 30.0–36.0)
Monocytes Absolute: 0.5 10*3/uL (ref 0.1–1.0)
Monocytes Relative: 7 % (ref 3–12)
Neutro Abs: 5 10*3/uL (ref 1.7–7.7)
RBC: 4.28 MIL/uL (ref 3.87–5.11)
Rh Type: POSITIVE
Rubella: 1.05 Index — ABNORMAL HIGH (ref <0.90)

## 2013-01-01 NOTE — Telephone Encounter (Signed)
Called to confirm that patient NOT taking minocycline as RN and MD note yesterday conflicting. Pt states she IS taking it for cystic acne. I told her to stop and explained risks of tetracycline class drugs in pregnancy. Referral to dermatology requested. If not able to get in soon, will consider starting keflex as alternative during pregnancy. Also confirmed that patient will be scheduled for f/u ultrasound for dating since prior ultrasound too early to be accurate and discrepant with her LMP.  Napoleon Form, MD

## 2013-01-01 NOTE — Addendum Note (Signed)
Addended by: Napoleon Form on: 01/01/2013 09:09 AM   Modules accepted: Orders

## 2013-01-02 ENCOUNTER — Ambulatory Visit (HOSPITAL_COMMUNITY)
Admission: RE | Admit: 2013-01-02 | Discharge: 2013-01-02 | Disposition: A | Discharge status: Home / Self Care | Payer: Medicaid Other | Source: Ambulatory Visit | Attending: Family Medicine | Admitting: Family Medicine

## 2013-01-02 DIAGNOSIS — O09529 Supervision of elderly multigravida, unspecified trimester: Secondary | ICD-10-CM | POA: Insufficient documentation

## 2013-01-02 DIAGNOSIS — Z3492 Encounter for supervision of normal pregnancy, unspecified, second trimester: Secondary | ICD-10-CM

## 2013-01-02 DIAGNOSIS — Z3689 Encounter for other specified antenatal screening: Secondary | ICD-10-CM | POA: Insufficient documentation

## 2013-01-07 ENCOUNTER — Encounter: Payer: Self-pay | Admitting: Family Medicine

## 2013-01-07 ENCOUNTER — Telehealth: Payer: Self-pay | Admitting: *Deleted

## 2013-01-07 NOTE — Telephone Encounter (Signed)
Called Virginia Sawyer and informed her pap results abnormal and needs colposcopy and we can do that at her next scheduled ob visit. [name] also asked if her blood tests came back ok- reviewed chart and informed her at this time normal results.  Patient also asked if her EDD changed based on second ultrasound- informed her Dr. Thad Ranger may not have reviewed result yet and she would make that decision - but per Ultrasound her EDD was unchanged, but I would send a note to ask Dr. Thad Ranger and if she changes it, we would call her. Patient voices understanding.

## 2013-01-07 NOTE — Telephone Encounter (Signed)
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 Thanks. ------

## 2013-01-28 ENCOUNTER — Other Ambulatory Visit (HOSPITAL_COMMUNITY)
Admission: RE | Admit: 2013-01-28 | Discharge: 2013-01-28 | Disposition: A | Discharge status: Home / Self Care | Payer: Medicaid Other | Source: Ambulatory Visit | Attending: Family Medicine | Admitting: Family Medicine

## 2013-01-28 ENCOUNTER — Other Ambulatory Visit: Payer: Self-pay | Admitting: Family Medicine

## 2013-01-28 ENCOUNTER — Ambulatory Visit (INDEPENDENT_AMBULATORY_CARE_PROVIDER_SITE_OTHER): Payer: Medicaid Other | Admitting: Family Medicine

## 2013-01-28 DIAGNOSIS — Z3492 Encounter for supervision of normal pregnancy, unspecified, second trimester: Secondary | ICD-10-CM

## 2013-01-28 DIAGNOSIS — N871 Moderate cervical dysplasia: Secondary | ICD-10-CM

## 2013-01-28 DIAGNOSIS — O09529 Supervision of elderly multigravida, unspecified trimester: Secondary | ICD-10-CM

## 2013-01-28 LAB — POCT URINALYSIS DIP (DEVICE)
Ketones, ur: NEGATIVE mg/dL
Protein, ur: NEGATIVE mg/dL
Specific Gravity, Urine: 1.025 (ref 1.005–1.030)
Urobilinogen, UA: 0.2 mg/dL (ref 0.0–1.0)

## 2013-01-28 NOTE — Progress Notes (Signed)
Pulse- 82 Patient reports lower abdominal/pelvic pain and pressure; patient still reports bleeding after sex and sometimes during, states sometimes it is a lot

## 2013-01-28 NOTE — Progress Notes (Signed)
Informal [place] for FHR - 150 per M-mode. FM observed.

## 2013-01-28 NOTE — Patient Instructions (Addendum)
Colposcopy Care After Colposcopy is a procedure in which a special tool is used to magnify the surface of the cervix. A tissue sample (biopsy) may also be taken. This sample will be looked at for cervical cancer or other problems. After the test:  You may have some cramping.  Lie down for a few minutes if you feel lightheaded.   You may have some bleeding which should stop in a few days. HOME CARE  Do not have sex or use tampons for 2 to 3 days or as told.  Only take medicine as told by your doctor.  Continue to take your birth control pills as usual. Finding out the results of your test Ask when your test results will be ready. Make sure you get your test results. GET HELP RIGHT AWAY IF:  You are bleeding a lot or are passing blood clots.  You develop a fever of 102 F (38.9 C) or higher.  You have abnormal vaginal discharge.  You have cramps that do not go away with medicine.  You feel lightheaded, dizzy, or pass out (faint). MAKE SURE YOU:   Understand these instructions.  Will watch your condition.  Will get help right away if you are not doing well or get worse. Document Released: 03/19/2008 Document Revised: 12/24/2011 Document Reviewed: 03/19/2008 [facility] Patient Information 2013 [place], [place].  Pregnancy - Second Trimester The second trimester of pregnancy (3 to 6 months) is a period of rapid growth for you and your baby. At the end of the sixth month, your baby is about 9 inches long and weighs 1 1/2 pounds. You will begin to feel the baby move between 18 and 20 weeks of the pregnancy. This is called quickening. Weight gain is faster. A clear fluid (colostrum) may leak out of your breasts. You may feel small contractions of the womb (uterus). This is known as false labor or Braxton-Hicks contractions. This is like a practice for labor when the baby is ready to be born. Usually, the problems with morning sickness have usually passed by the end of your first  trimester. Some women develop small dark blotches (called cholasma, mask of pregnancy) on their face that usually goes away after the baby is born. Exposure to the sun makes the blotches worse. Acne may also develop in some pregnant women and pregnant women who have acne, may find that it goes away. PRENATAL EXAMS  Blood work may continue to be done during prenatal exams. These tests are done to check on your health and the probable health of your baby. Blood work is used to follow your blood levels (hemoglobin). Anemia (low hemoglobin) is common during pregnancy. Iron and vitamins are given to help prevent this. You will also be checked for diabetes between 24 and 28 weeks of the pregnancy. Some of the previous blood tests may be repeated.  The size of the uterus is measured during each visit. This is to make sure that the baby is continuing to grow properly according to the dates of the pregnancy.  Your blood pressure is checked every prenatal visit. This is to make sure you are not getting toxemia.  Your urine is checked to make sure you do not have an infection, diabetes or protein in the urine.  Your weight is checked often to make sure gains are happening at the suggested rate. This is to ensure that both you and your baby are growing normally.  Sometimes, an ultrasound is performed to confirm the proper growth and  development of the baby. This is a test which bounces harmless sound waves off the baby so your caregiver can more accurately determine due dates. Sometimes, a specialized test is done on the amniotic fluid surrounding the baby. This test is called an amniocentesis. The amniotic fluid is obtained by sticking a needle into the belly (abdomen). This is done to check the chromosomes in instances where there is a concern about possible genetic problems with the baby. It is also sometimes done near the end of pregnancy if an early delivery is required. In this case, it is done to help make  sure the baby's lungs are mature enough for the baby to live outside of the womb. CHANGES OCCURING IN THE SECOND TRIMESTER OF PREGNANCY Your body goes through many changes during pregnancy. They vary from person to person. Talk to your caregiver about changes you notice that you are concerned about.  During the second trimester, you will likely have an increase in your appetite. It is normal to have cravings for certain foods. This varies from person to person and pregnancy to pregnancy.  Your lower abdomen will begin to bulge.  You may have to urinate more often because the uterus and baby are pressing on your bladder. It is also common to get more bladder infections during pregnancy (pain with urination). You can help this by drinking lots of fluids and emptying your bladder before and after intercourse.  You may begin to get stretch marks on your hips, abdomen, and breasts. These are normal changes in the body during pregnancy. There are no exercises or medications to take that prevent this change.  You may begin to develop swollen and bulging veins (varicose veins) in your legs. Wearing support hose, elevating your feet for 15 minutes, 3 to 4 times a day and limiting salt in your diet helps lessen the problem.  Heartburn may develop as the uterus grows and pushes up against the stomach. Antacids recommended by your caregiver helps with this problem. Also, eating smaller meals 4 to 5 times a day helps.  Constipation can be treated with a stool softener or adding bulk to your diet. Drinking lots of fluids, vegetables, fruits, and whole grains are helpful.  Exercising is also helpful. If you have been very active up until your pregnancy, most of these activities can be continued during your pregnancy. If you have been less active, it is helpful to start an exercise program such as walking.  Hemorrhoids (varicose veins in the rectum) may develop at the end of the second trimester. Warm sitz baths  and hemorrhoid cream recommended by your caregiver helps hemorrhoid problems.  Backaches may develop during this time of your pregnancy. Avoid heavy lifting, wear low heal shoes and practice good posture to help with backache problems.  Some pregnant women develop tingling and numbness of their hand and fingers because of swelling and tightening of ligaments in the wrist (carpel tunnel syndrome). This goes away after the baby is born.  As your breasts enlarge, you may have to get a bigger bra. Get a comfortable, cotton, support bra. Do not get a nursing bra until the last month of the pregnancy if you will be nursing the baby.  You may get a dark line from your belly button to the pubic area called the linea nigra.  You may develop rosy cheeks because of increase blood flow to the face.  You may develop spider looking lines of the face, neck, arms and chest. These go  away after the baby is born. HOME CARE INSTRUCTIONS   It is extremely important to avoid all smoking, herbs, alcohol, and unprescribed drugs during your pregnancy. These chemicals affect the formation and growth of the baby. Avoid these chemicals throughout the pregnancy to ensure the delivery of a healthy infant.  Most of your home care instructions are the same as suggested for the first trimester of your pregnancy. Keep your caregiver's appointments. Follow your caregiver's instructions regarding medication use, exercise and diet.  During pregnancy, you are providing food for you and your baby. Continue to eat regular, well-balanced meals. Choose foods such as meat, fish, milk and other low fat dairy products, vegetables, fruits, and whole-grain breads and cereals. Your caregiver will tell you of the ideal weight gain.  A physical sexual relationship may be continued up until near the end of pregnancy if there are no other problems. Problems could include early (premature) leaking of amniotic fluid from the membranes, vaginal  bleeding, abdominal pain, or other medical or pregnancy problems.  Exercise regularly if there are no restrictions. Check with your caregiver if you are unsure of the safety of some of your exercises. The greatest weight gain will occur in the last 2 trimesters of pregnancy. Exercise will help you:  Control your weight.  Get you in shape for labor and delivery.  Lose weight after you have the baby.  Wear a good support or jogging bra for breast tenderness during pregnancy. This may help if worn during sleep. Pads or tissues may be used in the bra if you are leaking colostrum.  Do not use hot tubs, steam rooms or saunas throughout the pregnancy.  Wear your seat belt at all times when driving. This protects you and your baby if you are in an accident.  Avoid raw meat, uncooked cheese, cat litter boxes and soil used by cats. These carry germs that can cause birth defects in the baby.  The second trimester is also a good time to visit your dentist for your dental health if this has not been done yet. Getting your teeth cleaned is OK. Use a soft toothbrush. Brush gently during pregnancy.  It is easier to loose urine during pregnancy. Tightening up and strengthening the pelvic muscles will help with this problem. Practice stopping your urination while you are going to the bathroom. These are the same muscles you need to strengthen. It is also the muscles you would use as if you were trying to stop from passing gas. You can practice tightening these muscles up 10 times a set and repeating this about 3 times per day. Once you know what muscles to tighten up, do not perform these exercises during urination. It is more likely to contribute to an infection by backing up the urine.  Ask for help if you have financial, counseling or nutritional needs during pregnancy. Your caregiver will be able to offer counseling for these needs as well as refer you for other special needs.  Your skin may become oily.  If so, wash your face with mild soap, use non-greasy moisturizer and oil or cream based makeup. MEDICATIONS AND DRUG USE IN PREGNANCY  Take prenatal vitamins as directed. The vitamin should contain 1 milligram of folic acid. Keep all vitamins out of reach of children. Only a couple vitamins or tablets containing iron may be fatal to a baby or young child when ingested.  Avoid use of all medications, including herbs, over-the-counter medications, not prescribed or suggested by your caregiver.  Only take over-the-counter or prescription medicines for pain, discomfort, or fever as directed by your caregiver. Do not use aspirin.  Let your caregiver also know about herbs you may be using.  Alcohol is related to a number of birth defects. This includes fetal alcohol syndrome. All alcohol, in any form, should be avoided completely. Smoking will cause low birth rate and premature babies.  Street or illegal drugs are very harmful to the baby. They are absolutely forbidden. A baby born to an addicted mother will be addicted at birth. The baby will go through the same withdrawal an adult does. SEEK MEDICAL CARE IF:  You have any concerns or worries during your pregnancy. It is better to call with your questions if you feel they cannot wait, rather than worry about them. SEEK IMMEDIATE MEDICAL CARE IF:   An unexplained oral temperature above 102 F (38.9 C) develops, or as your caregiver suggests.  You have leaking of fluid from the vagina (birth canal). If leaking membranes are suspected, take your temperature and tell your caregiver of this when you call.  There is vaginal spotting, bleeding, or passing clots. Tell your caregiver of the amount and how many pads are used. Light spotting in pregnancy is common, especially following intercourse.  You develop a bad smelling vaginal discharge with a change in the color from clear to white.  You continue to feel sick to your stomach (nauseated) and have no  relief from remedies suggested. You vomit blood or coffee ground-like materials.  You lose more than 2 pounds of weight or gain more than 2 pounds of weight over 1 week, or as suggested by your caregiver.  You notice swelling of your face, hands, feet, or legs.  You get exposed to [place] measles and have never had them.  You are exposed to fifth disease or chickenpox.  You develop belly (abdominal) pain. Round ligament discomfort is a common non-cancerous (benign) cause of abdominal pain in pregnancy. Your caregiver still must evaluate you.  You develop a bad headache that does not go away.  You develop fever, diarrhea, pain with urination, or shortness of breath.  You develop visual problems, blurry, or double vision.  You fall or are in a car accident or any kind of trauma.  There is mental or physical violence at home. Document Released: 09/25/2001 Document Revised: 12/24/2011 Document Reviewed: 03/30/2009 [facility] Patient Information 2013 [place], [place].

## 2013-01-28 NOTE — Progress Notes (Signed)
Had episode of bleeding after intercourse on Saturday, states more than just spotting, but resolved now. No vaginal discharge, some mild cramping occasionally. Pt desires genetic testing. Quad screen and 18-week fetal survey ordered. Pt will defer genetic counseling until after ultrasound. Very tearful and anxious today.  COLPOSCOPY PROCEDURE NOTE History:  Virginia Sawyer is a 37 y.o. 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 She states she has had abnml pap smears before but does not recall having had a colposcopy previously, states she was treated with antibiotics. Pt is a smoker, trying to quit.  Discussed role for HPV in cervical dysplasia, need for surveillance. Also discussed likely need for procedure after delivery. Patient given informed consent, signed copy in the chart, time out was performed.  Placed in lithotomy position. Cervix viewed with speculum and colposcope after application of acetic acid.  Colposcopy adequate. Findings: acetowhite lesion(s) noted at 2 and 9 o'clock, abnormal vessels (mosaicism/punctation) noted at 1-3 o'clock, 8-11 o;clock; biopsies obtained.  ECC specimen NOT obtained due to pregnancy. All specimens were labelled and sent to pathology. Monsel's solution applied and hemostasis noted.  Patient was given post procedure instructions.  Will follow up pathology and manage accordingly.

## 2013-02-03 ENCOUNTER — Telehealth: Payer: Self-pay | Admitting: *Deleted

## 2013-02-03 NOTE — Telephone Encounter (Addendum)
Pt left message stating that she had called and asked to speak with Dr. Thad Ranger and has apparently been transferred to nurse voice mail. She requests a call back form a nurse or Dr. Thad Ranger. I returned pt's call and discussed her concern. She stated that she is employed by a [employer]. She states that she is unable to perform her regular work functions because the equipment is very heavy and she can no longer lift it nor can she work the long hours which are required in her job. She is requesting a note which states this information. I told her that I will consult with Dr. Thad Ranger and call her back. After speaking with Dr. Thad Ranger, I called pt and informed her that we can compose a letter which states the limitations that she should have during pregnancy, however it will not state that she is unable to work. She may work up to 8hrs per Zimir Kittleson and should not lift more than 20 lbs. Pt then stated that she has a form to be completed. I told her that I will complete it and include the information provided by Dr. Thad Ranger.  Pt voiced understanding and will bring the form to the clinic on 02/09/13.

## 2013-02-05 ENCOUNTER — Encounter: Payer: Self-pay | Admitting: *Deleted

## 2013-02-06 ENCOUNTER — Encounter: Payer: Self-pay | Admitting: *Deleted

## 2013-02-06 ENCOUNTER — Ambulatory Visit (HOSPITAL_COMMUNITY)
Admission: RE | Admit: 2013-02-06 | Discharge: 2013-02-06 | Disposition: A | Discharge status: Home / Self Care | Payer: Medicaid Other | Source: Ambulatory Visit | Attending: Family Medicine | Admitting: Family Medicine

## 2013-02-06 DIAGNOSIS — Z1389 Encounter for screening for other disorder: Secondary | ICD-10-CM | POA: Insufficient documentation

## 2013-02-06 DIAGNOSIS — O99330 Smoking (tobacco) complicating pregnancy, unspecified trimester: Secondary | ICD-10-CM | POA: Insufficient documentation

## 2013-02-06 DIAGNOSIS — Z363 Encounter for antenatal screening for malformations: Secondary | ICD-10-CM | POA: Insufficient documentation

## 2013-02-06 DIAGNOSIS — O09529 Supervision of elderly multigravida, unspecified trimester: Secondary | ICD-10-CM | POA: Insufficient documentation

## 2013-02-06 DIAGNOSIS — O358XX0 Maternal care for other (suspected) fetal abnormality and damage, not applicable or unspecified: Secondary | ICD-10-CM | POA: Insufficient documentation

## 2013-02-07 ENCOUNTER — Encounter: Payer: Self-pay | Admitting: Family Medicine

## 2013-02-07 ENCOUNTER — Other Ambulatory Visit: Payer: Self-pay | Admitting: Family Medicine

## 2013-02-07 DIAGNOSIS — O283 Abnormal ultrasonic finding on antenatal screening of mother: Secondary | ICD-10-CM

## 2013-02-11 ENCOUNTER — Telehealth: Payer: Self-pay | Admitting: *Deleted

## 2013-02-11 NOTE — Telephone Encounter (Signed)
Scheduled follow up ultrasound for same day as next ob appt and called patient and left message has ob appt 02/25/13 at 745 and [place] at 0945. Call if you have any questions.

## 2013-02-11 NOTE — Telephone Encounter (Signed)
Message copied by Gerome Apley on Wed Feb 11, 2013 11:43 AM ------      Message from: FERRY, [place]      Created: Sat Feb 07, 2013  9:11 AM      Regarding: repeat ultrasound       Some anatomy not well visualized . Orders put in for f/u sono in 2 weeks.  ------

## 2013-02-25 ENCOUNTER — Ambulatory Visit (HOSPITAL_COMMUNITY)
Admission: RE | Admit: 2013-02-25 | Discharge: 2013-02-25 | Disposition: A | Discharge status: Home / Self Care | Payer: Medicaid Other | Source: Ambulatory Visit | Attending: Family Medicine | Admitting: Family Medicine

## 2013-02-25 ENCOUNTER — Ambulatory Visit (INDEPENDENT_AMBULATORY_CARE_PROVIDER_SITE_OTHER): Payer: Medicaid Other | Admitting: Advanced Practice Midwife

## 2013-02-25 DIAGNOSIS — Z1389 Encounter for screening for other disorder: Secondary | ICD-10-CM | POA: Insufficient documentation

## 2013-02-25 DIAGNOSIS — O283 Abnormal ultrasonic finding on antenatal screening of mother: Secondary | ICD-10-CM

## 2013-02-25 DIAGNOSIS — Z363 Encounter for antenatal screening for malformations: Secondary | ICD-10-CM | POA: Insufficient documentation

## 2013-02-25 DIAGNOSIS — O09529 Supervision of elderly multigravida, unspecified trimester: Secondary | ICD-10-CM

## 2013-02-25 DIAGNOSIS — O358XX0 Maternal care for other (suspected) fetal abnormality and damage, not applicable or unspecified: Secondary | ICD-10-CM | POA: Insufficient documentation

## 2013-02-25 LAB — POCT URINALYSIS DIP (DEVICE)
Protein, ur: NEGATIVE mg/dL
Specific Gravity, Urine: 1.025 (ref 1.005–1.030)
Urobilinogen, UA: 0.2 mg/dL (ref 0.0–1.0)

## 2013-02-25 NOTE — Progress Notes (Signed)
P=82 c/o generalized trace edema, c/o pelvic pain /pressure, increased mucousy discharge with bad odor

## 2013-02-25 NOTE — Progress Notes (Signed)
No complaints. Discussed smoking cessation, and abnormal pap. Has FU appointment for [place] scheduled.

## 2013-03-07 ENCOUNTER — Encounter: Payer: Self-pay | Admitting: Family Medicine

## 2013-03-25 ENCOUNTER — Ambulatory Visit (INDEPENDENT_AMBULATORY_CARE_PROVIDER_SITE_OTHER): Payer: Medicaid Other | Admitting: Obstetrics and Gynecology

## 2013-03-25 ENCOUNTER — Encounter: Payer: Self-pay | Admitting: *Deleted

## 2013-03-25 ENCOUNTER — Encounter: Payer: Self-pay | Admitting: Obstetrics and Gynecology

## 2013-03-25 DIAGNOSIS — O289 Unspecified abnormal findings on antenatal screening of mother: Secondary | ICD-10-CM

## 2013-03-25 DIAGNOSIS — O09529 Supervision of elderly multigravida, unspecified trimester: Secondary | ICD-10-CM

## 2013-03-25 DIAGNOSIS — O283 Abnormal ultrasonic finding on antenatal screening of mother: Secondary | ICD-10-CM

## 2013-03-25 LAB — POCT URINALYSIS DIP (DEVICE)
Hgb urine dipstick: NEGATIVE
Ketones, ur: NEGATIVE mg/dL
Protein, ur: NEGATIVE mg/dL
Specific Gravity, Urine: 1.02 (ref 1.005–1.030)
Urobilinogen, UA: 0.2 mg/dL (ref 0.0–1.0)

## 2013-03-25 NOTE — Progress Notes (Signed)
Reviewed [place] with fetal renal pyelectasis and plan for F/U US at 34 wks. Had 9# baby, no GDM or SD. Denies VB. Good FM. Discussed smoking cessation> rare cigarette and trying to quit. Constipation - on stool softeners and other agents. Discussed diet.

## 2013-03-25 NOTE — Progress Notes (Signed)
Pulse- 87 

## 2013-03-25 NOTE — Patient Instructions (Signed)

## 2013-04-08 ENCOUNTER — Ambulatory Visit (INDEPENDENT_AMBULATORY_CARE_PROVIDER_SITE_OTHER): Payer: Medicaid Other | Admitting: Family Medicine

## 2013-04-08 VITALS — BP 103/64 | Temp 98.0°F | Wt 174.0 lb

## 2013-04-08 DIAGNOSIS — O09529 Supervision of elderly multigravida, unspecified trimester: Secondary | ICD-10-CM

## 2013-04-08 DIAGNOSIS — Z3492 Encounter for supervision of normal pregnancy, unspecified, second trimester: Secondary | ICD-10-CM

## 2013-04-08 LAB — POCT URINALYSIS DIP (DEVICE)
Glucose, UA: NEGATIVE mg/dL
Hgb urine dipstick: NEGATIVE
Ketones, ur: NEGATIVE mg/dL
Specific Gravity, Urine: 1.02 (ref 1.005–1.030)

## 2013-04-08 NOTE — Progress Notes (Signed)
Pulse 85 Edema trace states "all over body".

## 2013-04-08 NOTE — Progress Notes (Signed)
No complaints

## 2013-04-08 NOTE — Patient Instructions (Signed)

## 2013-04-09 ENCOUNTER — Encounter: Payer: Self-pay | Admitting: Family Medicine

## 2013-04-09 LAB — GLUCOSE TOLERANCE, 1 HOUR (50G) W/O FASTING: Glucose, 1 Hour GTT: 89 mg/dL (ref 70–140)

## 2013-04-09 LAB — HIV ANTIBODY (ROUTINE TESTING W REFLEX): HIV: NONREACTIVE

## 2013-04-09 LAB — CBC
HCT: 31.2 % — ABNORMAL LOW (ref 36.0–46.0)
Hemoglobin: 10.7 g/dL — ABNORMAL LOW (ref 12.0–15.0)
MCHC: 34.3 g/dL (ref 30.0–36.0)
MCV: 88.9 fL (ref 78.0–100.0)
RDW: 13.7 % (ref 11.5–15.5)
WBC: 10.7 10*3/uL — ABNORMAL HIGH (ref 4.0–10.5)

## 2013-04-29 ENCOUNTER — Ambulatory Visit (INDEPENDENT_AMBULATORY_CARE_PROVIDER_SITE_OTHER): Payer: Medicaid Other | Admitting: Advanced Practice Midwife

## 2013-04-29 ENCOUNTER — Telehealth: Payer: Self-pay | Admitting: *Deleted

## 2013-04-29 VITALS — BP 105/67 | Wt 179.2 lb

## 2013-04-29 DIAGNOSIS — O358XX0 Maternal care for other (suspected) fetal abnormality and damage, not applicable or unspecified: Secondary | ICD-10-CM

## 2013-04-29 DIAGNOSIS — L709 Acne, unspecified: Secondary | ICD-10-CM

## 2013-04-29 NOTE — Patient Instructions (Signed)

## 2013-04-29 NOTE — Progress Notes (Signed)
Doing well. Had dysuria for one day. No further. Wants to birth unmedicated, hx rapid labors. Followup US scheduled.

## 2013-04-29 NOTE — Progress Notes (Signed)
Pulse- 90 Patient reports occasional burning after having sex

## 2013-04-29 NOTE — Telephone Encounter (Addendum)
Needs referral to Dermatology.  Please schedule and contact patient with appointment.   05/04/2013 @5 :10 PM -- Referral made to [facility] as patient requested [phone]) for severe acne with abscess on face. Appt made at the earliest date on 08/04/2013@0815 . Patient notified and satisfied.   Routed to Admin to fax referral and last note to Fax: [phone].

## 2013-05-04 ENCOUNTER — Other Ambulatory Visit: Payer: Self-pay | Admitting: Obstetrics and Gynecology

## 2013-05-04 NOTE — Addendum Note (Signed)
Addended by: Toula Moos on: 05/04/2013 05:14 PM   Modules accepted: Orders

## 2013-05-15 ENCOUNTER — Ambulatory Visit (HOSPITAL_COMMUNITY)
Admission: RE | Admit: 2013-05-15 | Discharge: 2013-05-15 | Disposition: A | Discharge status: Home / Self Care | Payer: Medicaid Other | Source: Ambulatory Visit | Attending: Advanced Practice Midwife | Admitting: Advanced Practice Midwife

## 2013-05-15 ENCOUNTER — Encounter: Payer: Self-pay | Admitting: *Deleted

## 2013-05-15 ENCOUNTER — Other Ambulatory Visit: Payer: Self-pay | Admitting: Advanced Practice Midwife

## 2013-05-15 DIAGNOSIS — N1330 Unspecified hydronephrosis: Secondary | ICD-10-CM

## 2013-05-15 DIAGNOSIS — Z1389 Encounter for screening for other disorder: Secondary | ICD-10-CM | POA: Insufficient documentation

## 2013-05-15 DIAGNOSIS — Z363 Encounter for antenatal screening for malformations: Secondary | ICD-10-CM | POA: Insufficient documentation

## 2013-05-15 DIAGNOSIS — O358XX0 Maternal care for other (suspected) fetal abnormality and damage, not applicable or unspecified: Secondary | ICD-10-CM | POA: Insufficient documentation

## 2013-05-16 ENCOUNTER — Encounter: Payer: Self-pay | Admitting: Advanced Practice Midwife

## 2013-05-20 ENCOUNTER — Ambulatory Visit (INDEPENDENT_AMBULATORY_CARE_PROVIDER_SITE_OTHER): Payer: Medicaid Other | Admitting: Family Medicine

## 2013-05-20 VITALS — BP 108/69 | Temp 98.3°F | Wt 183.2 lb

## 2013-05-20 DIAGNOSIS — Z3483 Encounter for supervision of other normal pregnancy, third trimester: Secondary | ICD-10-CM

## 2013-05-20 DIAGNOSIS — O99330 Smoking (tobacco) complicating pregnancy, unspecified trimester: Secondary | ICD-10-CM

## 2013-05-20 DIAGNOSIS — O09523 Supervision of elderly multigravida, third trimester: Secondary | ICD-10-CM

## 2013-05-20 DIAGNOSIS — O99340 Other mental disorders complicating pregnancy, unspecified trimester: Secondary | ICD-10-CM

## 2013-05-20 DIAGNOSIS — O09529 Supervision of elderly multigravida, unspecified trimester: Secondary | ICD-10-CM

## 2013-05-20 DIAGNOSIS — Z23 Encounter for immunization: Secondary | ICD-10-CM

## 2013-05-20 LAB — POCT URINALYSIS DIP (DEVICE)
Nitrite: NEGATIVE
Protein, ur: NEGATIVE mg/dL
Urobilinogen, UA: 0.2 mg/dL (ref 0.0–1.0)

## 2013-05-20 MED ORDER — TETANUS-DIPHTH-ACELL PERTUSSIS 5-2.5-18.5 LF-MCG/0.5 IM SUSP
0.5000 mL | Freq: Once | INTRAMUSCULAR | Status: AC
  Administered 2013-05-20: 0.5 mL via INTRAMUSCULAR

## 2013-05-20 NOTE — Progress Notes (Signed)
Peds uro referral will be facilitated by Maternal Fetal Medicine dept as pt prefers for her baby to have surgery @ [facility] if necessary. Pt will be contacted by Marylene Land with appt information.

## 2013-05-20 NOTE — Addendum Note (Signed)
Addended by: Faythe Casa on: 05/20/2013 09:44 AM   Modules accepted: Orders

## 2013-05-20 NOTE — Progress Notes (Signed)
P=81, States still having frequent constipation. C/o increasing pelvic pressure.

## 2013-05-20 NOTE — Patient Instructions (Signed)

## 2013-05-20 NOTE — Progress Notes (Signed)
Virginia Sawyer is a 37 y.o. [id number] at [redacted]w[redacted]d here for ROB visit.  Discussed left renal enlargement. Placed Peds uro referral Discussed CIN 2/3 requiring LEEP. Plan to do PP Discussed placement of mirena PP.  Discussed with Patient:  -Plans to bottle feed.  All questions answered. -Continue prenatal vitamins. -Reviewed fetal kick counts Pt to perform daily at a time when the baby is active, lie laterally with both hands on belly in quiet room and count all movements (hiccups, shoulder rolls, obvious kicks, etc); pt is to report to clinic L&D for less than 10 movements felt in a one hour time period-pt told as soon as she counts 10 movements the count is complete.  - Routine precautions discussed (depression, infection s/s).   Patient provided with all pertinent phone numbers for emergencies. - RTC for any VB, regular, painful cramps/ctxs occurring at a rate of >2/10 min, fever (100.5 or higher), n/v/d, any pain that is unresolving or worsening, LOF, decreased fetal movement, CP, SOB, edema - RTC in 2 weeks for next appt.  Problems: Patient Active Problem List   Diagnosis Date Noted  . Abnormal fetal ultrasound 02/07/2013  . Advanced maternal age in pregnancy 01/28/2013  . Acne 12/31/2012  . Supervision of normal pregnancy 12/31/2012  . Seasonal affective disorder 01/24/2012  . Migraines 01/24/2012  . 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 Tdap will be given today  [x ] Vaccines: Flu:  Tdap: today [ x] BCM: mriena [ ]  Readiness: baby has a place to sleep, car seat, other baby necessities.  Edu: [x ] PTL precautions; [x ] BF class; [x ] childbirth class; [ ]   BF counseling;

## 2013-05-25 ENCOUNTER — Encounter: Payer: Self-pay | Admitting: *Deleted

## 2013-06-03 ENCOUNTER — Ambulatory Visit (INDEPENDENT_AMBULATORY_CARE_PROVIDER_SITE_OTHER): Payer: Medicaid Other | Admitting: Obstetrics and Gynecology

## 2013-06-03 VITALS — BP 97/64 | Temp 97.7°F | Wt 184.2 lb

## 2013-06-03 DIAGNOSIS — O283 Abnormal ultrasonic finding on antenatal screening of mother: Secondary | ICD-10-CM

## 2013-06-03 DIAGNOSIS — O09529 Supervision of elderly multigravida, unspecified trimester: Secondary | ICD-10-CM

## 2013-06-03 LAB — POCT URINALYSIS DIP (DEVICE)
Glucose, UA: NEGATIVE mg/dL
Nitrite: NEGATIVE
Urobilinogen, UA: 0.2 mg/dL (ref 0.0–1.0)

## 2013-06-03 NOTE — Progress Notes (Signed)
Doing well, no complaints today.  Denies LOF or VB.  Reports some mild, irregular ctx.  Reports lost mucous plug 4 days ago.  Asked about doing water birth here.  Informed will need to take water birthing classes.  Also wants to know how soon after LEEP she can get Mirena, informed can be same day.  Willing to take mini-pill in the interim if necessary.  Plans to deliver here.  Is setting up paperwork for potential baby surgery at [facility].

## 2013-06-03 NOTE — Progress Notes (Signed)
Pulse- 79  Edema-feet , "stomach when I am backed up" Pt reports loosing mucous plug

## 2013-06-03 NOTE — Patient Instructions (Signed)
Constipation, Adult Constipation is when a person has fewer than 3 bowel movements a week; has difficulty having a bowel movement; or has stools that are dry, hard, or larger than normal. As people grow older, constipation is more common. If you try to fix constipation with medicines that make you have a bowel movement (laxatives), the problem may get worse. Long-term laxative use may cause the muscles of the colon to become weak. A low-fiber diet, not taking in enough fluids, and taking certain medicines may make constipation worse. CAUSES   Certain medicines, such as antidepressants, pain medicine, iron supplements, antacids, and water pills.   Certain diseases, such as diabetes, irritable bowel syndrome (IBS), thyroid disease, or depression.   Not drinking enough water.   Not eating enough fiber-rich foods.   Stress or travel.  Lack of physical activity or exercise.  Not going to the restroom when there is the urge to have a bowel movement.  Ignoring the urge to have a bowel movement.  Using laxatives too much. SYMPTOMS   Having fewer than 3 bowel movements a week.   Straining to have a bowel movement.   Having hard, dry, or larger than normal stools.   Feeling full or bloated.   Pain in the lower abdomen.  Not feeling relief after having a bowel movement. DIAGNOSIS  Your caregiver will take a medical history and perform a physical exam. Further testing may be done for severe constipation. Some tests may include:   A barium enema X-ray to examine your rectum, colon, and sometimes, your small intestine.  A sigmoidoscopy to examine your lower colon.  A colonoscopy to examine your entire colon. TREATMENT  Treatment will depend on the severity of your constipation and what is causing it. Some dietary treatments include drinking more fluids and eating more fiber-rich foods. Lifestyle treatments may include regular exercise. If these diet and lifestyle recommendations  do not help, your caregiver may recommend taking over-the-counter laxative medicines to help you have bowel movements. Prescription medicines may be prescribed if over-the-counter medicines do not work.  HOME CARE INSTRUCTIONS   Increase dietary fiber in your diet, such as fruits, vegetables, whole grains, and beans. Limit high-fat and processed sugars in your diet, such as [place] fries, hamburgers, cookies, candies, and soda.   A fiber supplement may be added to your diet if you cannot get enough fiber from foods.   Drink enough fluids to keep your urine clear or pale yellow.   Exercise regularly or as directed by your caregiver.   Go to the restroom when you have the urge to go. Do not hold it.  Only take medicines as directed by your caregiver. Do not take other medicines for constipation without talking to your caregiver first. SEEK IMMEDIATE MEDICAL CARE IF:   You have bright red blood in your stool.   Your constipation lasts for more than 4 days or gets worse.   You have abdominal or rectal pain.   You have thin, pencil-like stools.  You have unexplained weight loss. MAKE SURE YOU:   Understand these instructions.  Will watch your condition.  Will get help right away if you are not doing well or get worse. Document Released: 06/29/2004 Document Revised: 12/24/2011 Document Reviewed: 09/04/2011 [facility] Patient Information 2014 [place], [place]. Pregnancy - Third Trimester The third trimester of pregnancy (the last 3 months) is a period of the most rapid growth for you and your baby. The baby approaches a length of 20 inches and a  weight of 6 to 10 pounds. The baby is adding on fat and getting ready for life outside your body. While inside, babies have periods of sleeping and waking, sucking thumbs, and hiccuping. You can often feel small contractions of the uterus. This is false labor. It is also called Braxton-Hicks contractions. This is like a practice for labor. The  usual problems in this stage of pregnancy include more difficulty breathing, swelling of the hands and feet from water retention, and having to urinate more often because of the uterus and baby pressing on your bladder.  PRENATAL EXAMS  Blood work may continue to be done during prenatal exams. These tests are done to check on your health and the probable health of your baby. Blood work is used to follow your blood levels (hemoglobin). Anemia (low hemoglobin) is common during pregnancy. Iron and vitamins are given to help prevent this. You may also continue to be checked for diabetes. Some of the past blood tests may be done again.  The size of the uterus is measured during each visit. This makes sure your baby is growing properly according to your pregnancy dates.  Your blood pressure is checked every prenatal visit. This is to make sure you are not getting toxemia.  Your urine is checked every prenatal visit for infection, diabetes, and protein.  Your weight is checked at each visit. This is done to make sure gains are happening at the suggested rate and that you and your baby are growing normally.  Sometimes, an ultrasound is performed to confirm the position and the proper growth and development of the baby. This is a test done that bounces harmless sound waves off the baby so your caregiver can more accurately determine a due date.  Discuss the type of pain medicine and anesthesia you will have during your labor and delivery.  Discuss the possibility and anesthesia if a cesarean section might be necessary.  Inform your caregiver if there is any mental or physical violence at home. Sometimes, a specialized non-stress test, contraction stress test, and biophysical profile are done to make sure the baby is not having a problem. Checking the amniotic fluid surrounding the baby is called an amniocentesis. The amniotic fluid is removed by sticking a needle into the belly (abdomen). This is  sometimes done near the end of pregnancy if an early delivery is required. In this case, it is done to help make sure the baby's lungs are mature enough for the baby to live outside of the womb. If the lungs are not mature and it is unsafe to deliver the baby, an injection of cortisone medicine is given to the mother 1 to 2 days before the delivery. This helps the baby's lungs mature and makes it safer to deliver the baby. CHANGES OCCURING IN THE THIRD TRIMESTER OF PREGNANCY Your body goes through many changes during pregnancy. They vary from person to person. Talk to your caregiver about changes you notice and are concerned about.  During the last trimester, you have probably had an increase in your appetite. It is normal to have cravings for certain foods. This varies from person to person and pregnancy to pregnancy.  You may begin to get stretch marks on your hips, abdomen, and breasts. These are normal changes in the body during pregnancy. There are no exercises or medicines to take which prevent this change.  Constipation may be treated with a stool softener or adding bulk to your diet. Drinking lots of fluids, fiber in  vegetables, fruits, and whole grains are helpful.  Exercising is also helpful. If you have been very active up until your pregnancy, most of these activities can be continued during your pregnancy. If you have been less active, it is helpful to start an exercise program such as walking. Consult your caregiver before starting exercise programs.  Avoid all smoking, alcohol, non-prescribed drugs, herbs and "street drugs" during your pregnancy. These chemicals affect the formation and growth of the baby. Avoid chemicals throughout the pregnancy to ensure the delivery of a healthy infant.  Backache, varicose veins, and hemorrhoids may develop or get worse.  You will tire more easily in the third trimester, which is normal.  The baby's movements may be stronger and more often.  You  may become short of breath easily.  Your belly button may stick out.  A yellow discharge may leak from your breasts called colostrum.  You may have a bloody mucus discharge. This usually occurs a few days to a week before labor begins. HOME CARE INSTRUCTIONS   Keep your caregiver's appointments. Follow your caregiver's instructions regarding medicine use, exercise, and diet.  During pregnancy, you are providing food for you and your baby. Continue to eat regular, well-balanced meals. Choose foods such as meat, fish, milk and other low fat dairy products, vegetables, fruits, and whole-grain breads and cereals. Your caregiver will tell you of the ideal weight gain.  A physical sexual relationship may be continued throughout pregnancy if there are no other problems such as early (premature) leaking of amniotic fluid from the membranes, vaginal bleeding, or belly (abdominal) pain.  Exercise regularly if there are no restrictions. Check with your caregiver if you are unsure of the safety of your exercises. Greater weight gain will occur in the last 2 trimesters of pregnancy. Exercising helps:  Control your weight.  Get you in shape for labor and delivery.  You lose weight after you deliver.  Rest a lot with legs elevated, or as needed for leg cramps or low back pain.  Wear a good support or jogging bra for breast tenderness during pregnancy. This may help if worn during sleep. Pads or tissues may be used in the bra if you are leaking colostrum.  Do not use hot tubs, steam rooms, or saunas.  Wear your seat belt when driving. This protects you and your baby if you are in an accident.  Avoid raw meat, cat litter boxes and soil used by cats. These carry germs that can cause birth defects in the baby.  It is easier to leak urine during pregnancy. Tightening up and strengthening the pelvic muscles will help with this problem. You can practice stopping your urination while you are going to the  bathroom. These are the same muscles you need to strengthen. It is also the muscles you would use if you were trying to stop from passing gas. You can practice tightening these muscles up 10 times a set and repeating this about 3 times per day. Once you know what muscles to tighten up, do not perform these exercises during urination. It is more likely to cause an infection by backing up the urine.  Ask for help if you have financial, counseling, or nutritional needs during pregnancy. Your caregiver will be able to offer counseling for these needs as well as refer you for other special needs.  Make a list of emergency phone numbers and have them available.  Plan on getting help from family or friends when you go home  from the hospital.  Make a trial run to the hospital.  Take prenatal classes with the father to understand, practice, and ask questions about the labor and delivery.  Prepare the baby's room or nursery.  Do not travel out of the city unless it is absolutely necessary and with the advice of your caregiver.  Wear only low or no heal shoes to have better balance and prevent falling. MEDICINES AND DRUG USE IN PREGNANCY  Take prenatal vitamins as directed. The vitamin should contain 1 milligram of folic acid. Keep all vitamins out of reach of children. Only a couple vitamins or tablets containing iron may be fatal to a baby or young child when ingested.  Avoid use of all medicines, including herbs, over-the-counter medicines, not prescribed or suggested by your caregiver. Only take over-the-counter or prescription medicines for pain, discomfort, or fever as directed by your caregiver. Do not use aspirin, ibuprofen or naproxen unless approved by your caregiver.  Let your caregiver also know about herbs you may be using.  Alcohol is related to a number of birth defects. This includes fetal alcohol syndrome. All alcohol, in any form, should be avoided completely. Smoking will cause low  birth rate and premature babies.  Illegal drugs are very harmful to the baby. They are absolutely forbidden. A baby born to an addicted mother will be addicted at birth. The baby will go through the same withdrawal an adult does. SEEK MEDICAL CARE IF: You have any concerns or worries during your pregnancy. It is better to call with your questions if you feel they cannot wait, rather than worry about them. SEEK IMMEDIATE MEDICAL CARE IF:   An unexplained oral temperature above 102 F (38.9 C) develops, or as your caregiver suggests.  You have leaking of fluid from the vagina. If leaking membranes are suspected, take your temperature and tell your caregiver of this when you call.  There is vaginal spotting, bleeding or passing clots. Tell your caregiver of the amount and how many pads are used.  You develop a bad smelling vaginal discharge with a change in the color from clear to white.  You develop vomiting that lasts more than 24 hours.  You develop chills or fever.  You develop shortness of breath.  You develop burning on urination.  You loose more than 2 pounds of weight or gain more than 2 pounds of weight or as suggested by your caregiver.  You notice sudden swelling of your face, hands, and feet or legs.  You develop belly (abdominal) pain. Round ligament discomfort is a common non-cancerous (benign) cause of abdominal pain in pregnancy. Your caregiver still must evaluate you.  You develop a severe headache that does not go away.  You develop visual problems, blurred or double vision.  If you have not felt your baby move for more than 1 hour. If you think the baby is not moving as much as usual, eat something with sugar in it and lie down on your left side for an hour. The baby should move at least 4 to 5 times per hour. Call right away if your baby moves less than that.  You fall, are in a car accident, or any kind of trauma.  There is mental or physical violence at  home. Document Released: 09/25/2001 Document Revised: 06/25/2012 Document Reviewed: 03/30/2009 [facility] Patient Information 2014 [place], [place].

## 2013-06-03 NOTE — Progress Notes (Signed)
Uro appt in [place] next week. Discussed waterbirth>plans to go to class. Constipation improved.

## 2013-06-09 ENCOUNTER — Encounter (HOSPITAL_BASED_OUTPATIENT_CLINIC_OR_DEPARTMENT_OTHER): Payer: Self-pay

## 2013-06-09 ENCOUNTER — Emergency Department (HOSPITAL_BASED_OUTPATIENT_CLINIC_OR_DEPARTMENT_OTHER)
Admission: EM | Admit: 2013-06-09 | Discharge: 2013-06-09 | Disposition: A | Discharge status: Home / Self Care | Payer: Medicaid Other | Attending: Emergency Medicine | Admitting: Emergency Medicine

## 2013-06-09 ENCOUNTER — Telehealth: Payer: Self-pay | Admitting: *Deleted

## 2013-06-09 DIAGNOSIS — O9989 Other specified diseases and conditions complicating pregnancy, childbirth and the puerperium: Secondary | ICD-10-CM | POA: Insufficient documentation

## 2013-06-09 DIAGNOSIS — J329 Chronic sinusitis, unspecified: Secondary | ICD-10-CM | POA: Insufficient documentation

## 2013-06-09 DIAGNOSIS — J029 Acute pharyngitis, unspecified: Secondary | ICD-10-CM | POA: Insufficient documentation

## 2013-06-09 DIAGNOSIS — Z87891 Personal history of nicotine dependence: Secondary | ICD-10-CM | POA: Insufficient documentation

## 2013-06-09 DIAGNOSIS — Z79899 Other long term (current) drug therapy: Secondary | ICD-10-CM | POA: Insufficient documentation

## 2013-06-09 DIAGNOSIS — R05 Cough: Secondary | ICD-10-CM | POA: Insufficient documentation

## 2013-06-09 DIAGNOSIS — R51 Headache: Secondary | ICD-10-CM | POA: Insufficient documentation

## 2013-06-09 DIAGNOSIS — Z8619 Personal history of other infectious and parasitic diseases: Secondary | ICD-10-CM | POA: Insufficient documentation

## 2013-06-09 DIAGNOSIS — R509 Fever, unspecified: Secondary | ICD-10-CM | POA: Insufficient documentation

## 2013-06-09 DIAGNOSIS — R059 Cough, unspecified: Secondary | ICD-10-CM | POA: Insufficient documentation

## 2013-06-09 HISTORY — DX: Chronic sinusitis, unspecified: J32.9

## 2013-06-09 MED ORDER — OXYMETAZOLINE HCL 0.05 % NA SOLN
1.0000 | Freq: Once | NASAL | Status: AC
  Administered 2013-06-09: 1 via NASAL
  Filled 2013-06-09: qty 15

## 2013-06-09 MED ORDER — AMOXICILLIN 500 MG PO CAPS: 500.0000 mg | ORAL_CAPSULE | Freq: Three times a day (TID) | ORAL | Status: DC

## 2013-06-09 NOTE — ED Notes (Signed)
Pt reports fatigue, decreased appetite, cough, fever, sinus drainage and sore throat that started on Sunday, worsening today.  Pt is [redacted] weeks pregnant and denies any abdominal pian, vaginal pressure, or vaginal d/c.  Fetal movement present per mother.

## 2013-06-09 NOTE — Progress Notes (Signed)
[facility] ED called regarding pt at [redacted] wks pregnant in for sick visit. Monitor applied, but unable to visualize tracing. Plan is to run paper strip and fax to L&D for reading.

## 2013-06-09 NOTE — ED Provider Notes (Signed)
CSN: [id number]     Arrival date & time 06/09/13  1033 History   First MD Initiated Contact with Patient 06/09/13 1148     Chief Complaint  Patient presents with  . Facial Pain  . Sore Throat  . Fever  . Cough   (Consider location/radiation/quality/duration/timing/severity/associated sxs/prior Treatment) HPI Comments: 37 year old female with a history of being [redacted] weeks pregnant, she also has a history of frequent sinus infections. She states that over the last several days she has had decreased appetite, mild cough, sinus drainage of purulent material and a sore throat. Nothing seems to make it better or worse, she denies any abdominal pain, nausea or vomiting and has had normal fetal movements. The symptoms are persistent, not associated with a fever or chills. Symptoms are moderate at this  Patient is a 37 y.o. female presenting with pharyngitis, fever, and cough. The history is provided by the patient.  Sore Throat  Fever Associated symptoms: cough   Cough Associated symptoms: fever     Past Medical History  Diagnosis Date  . Allergy   . [place] measles   . Sinus infection    Past Surgical History  Procedure Laterality Date  . Wisdom tooth extraction    . No past surgeries    . Lasix eye surgery      age 50   No family history on file. History  Substance Use Topics  . Smoking status: Former Smoker -- 10.00 packs/day    Types: Cigarettes    Quit date: 10/18/2012  . Smokeless tobacco: Never Used     Comment: PT SMOKES 10 CIG. PER DAY, currently does not smoke due to pregnancy  . Alcohol Use: Yes     Comment: social/ denies during pregnancy   OB History   Grav Para Term Preterm Abortions TAB SAB Ect Mult Living   5 3 3  1  1   3      Review of Systems  Constitutional: Positive for fever.  Respiratory: Positive for cough.   All other systems reviewed and are negative.    Allergies  Sulfa antibiotics  Home Medications   Current Outpatient Rx  Name  Route   Sig  Dispense  Refill  . amoxicillin (AMOXIL) 500 MG capsule   Oral   Take 1 capsule (500 mg total) by mouth 3 (three) times daily.   60 capsule   0   . docusate sodium (COLACE) 100 MG capsule   Oral   Take 100 mg by mouth 2 (two) times daily as needed.          . folic acid (FOLVITE) 1 MG tablet   Oral   Take by mouth daily.         . Prenatal Vit-Fe Fumarate-FA (MULTIVITAMIN-PRENATAL) 27-0.8 MG TABS   Oral   Take 1 tablet by mouth daily at 12 noon. [employer] multivitamin          BP 104/68  Pulse 92  Temp(Src) 98.1 F (36.7 C) (Oral)  Resp 18  Ht 5\' 5"  (1.651 m)  Wt 182 lb (82.555 kg)  BMI 30.29 kg/m2  SpO2 100%  LMP 10/10/2012 Physical Exam  Nursing note and vitals reviewed. Constitutional: She appears well-developed and well-nourished. No distress.  HENT:  Head: Normocephalic and atraumatic.  Mouth/Throat: Oropharynx is clear and moist. No oropharyngeal exudate.  Nasal passages with swollen turbinates on the right with mild nasal discharge, tenderness over the right maxillary sinuses  Eyes: Conjunctivae and EOM are normal. Pupils are  equal, round, and reactive to light. Right eye exhibits no discharge. Left eye exhibits no discharge. No scleral icterus.  Neck: Normal range of motion. Neck supple. No JVD present. No thyromegaly present.  Cardiovascular: Normal rate, regular rhythm, normal heart sounds and intact distal pulses.  Exam reveals no gallop and no friction rub.   No murmur heard. Pulmonary/Chest: Effort normal and breath sounds normal. No respiratory distress. She has no wheezes. She has no rales.  Abdominal: Soft. Bowel sounds are normal. She exhibits no distension and no mass. There is no tenderness.  Gravid uterus, abdomen nontender  Musculoskeletal: Normal range of motion. She exhibits no edema and no tenderness.  Lymphadenopathy:    She has no cervical adenopathy.  Neurological: She is alert. Coordination normal.  Skin: Skin is warm and dry. No rash  noted. No erythema.  Psychiatric: She has a normal mood and affect. Her behavior is normal.    ED Course  Procedures (including critical care time) Labs Review Labs Reviewed  RAPID STREP SCREEN  CULTURE, GROUP A STREP   Imaging Review No results found.  MDM   1. Sinusitis    The baby has normal fetal movements, her vital signs are normal, I suspect that she has early sinus infection and will be treated with Afrin and amoxicillin. She states her understanding to instructions and is amenable to discharge.  Meds given in ED:  Medications  oxymetazoline (AFRIN) 0.05 % nasal spray 1 spray (not administered)    New Prescriptions   AMOXICILLIN (AMOXIL) 500 MG CAPSULE    Take 1 capsule (500 mg total) by mouth 3 (three) times daily.        Vida Roller, MD 06/09/13 [phone]

## 2013-06-09 NOTE — Telephone Encounter (Addendum)
Pt left message requesting information regarding the birthing pool liner and supplies needed, where to buy, etc. Also, she wants to know how to reserve the pool at the hospital for use during labor. Please call back.  8/27  0910  Called pt and left message on her personal voice mail.  I provided information that she may purchase the necessary supplies at the [employer] in downtown [place] or here at [employer]. The cost for the supply kit here is $175 and includes everything needed except the birthing pool. She may use the pool here at the hospital however it is on a first come first served basis and could already be in use at the time she presents in labor.  There may be a rental fee for the birthing pool and I will call her back once I have that information. She may call back if she has additional questions.

## 2013-06-11 LAB — CULTURE, GROUP A STREP

## 2013-06-17 ENCOUNTER — Ambulatory Visit (INDEPENDENT_AMBULATORY_CARE_PROVIDER_SITE_OTHER): Payer: Medicaid Other | Admitting: Family Medicine

## 2013-06-17 VITALS — BP 98/70 | Temp 98.3°F | Wt 188.0 lb

## 2013-06-17 DIAGNOSIS — O283 Abnormal ultrasonic finding on antenatal screening of mother: Secondary | ICD-10-CM

## 2013-06-17 DIAGNOSIS — O09529 Supervision of elderly multigravida, unspecified trimester: Secondary | ICD-10-CM

## 2013-06-17 DIAGNOSIS — O289 Unspecified abnormal findings on antenatal screening of mother: Secondary | ICD-10-CM

## 2013-06-17 DIAGNOSIS — Z3483 Encounter for supervision of other normal pregnancy, third trimester: Secondary | ICD-10-CM

## 2013-06-17 LAB — OB RESULTS CONSOLE GC/CHLAMYDIA
Chlamydia: NEGATIVE
Gonorrhea: NEGATIVE

## 2013-06-17 LAB — POCT URINALYSIS DIP (DEVICE)
Glucose, UA: NEGATIVE mg/dL
Hgb urine dipstick: NEGATIVE
Specific Gravity, Urine: >=1.03 (ref 1.005–1.030)
Urobilinogen, UA: 0.2 mg/dL (ref 0.0–1.0)

## 2013-06-17 LAB — OB RESULTS CONSOLE GBS: GBS: NEGATIVE

## 2013-06-17 MED ORDER — CYCLOBENZAPRINE HCL 5 MG PO TABS: 5.0000 mg | ORAL_TABLET | Freq: Three times a day (TID) | ORAL | Status: DC | PRN

## 2013-06-17 MED ORDER — POLYETHYLENE GLYCOL 3350 17 GM/SCOOP PO POWD: 17.0000 g | Freq: Every day | ORAL | Status: DC

## 2013-06-17 NOTE — Patient Instructions (Signed)

## 2013-06-17 NOTE — Progress Notes (Signed)
  Subjective:    Virginia Sawyer is a 37 y.o. female being seen today for her obstetrical visit. She is at [redacted]w[redacted]d gestation. Patient reports no bleeding, no contractions, no cramping and no leaking. Fetal movement: normal. Complains of back pain that is minimally improved with APAP.  Recently seen in ED for sinus infection. On amoxacillin currently  Menstrual History: OB History   Grav Para Term Preterm Abortions TAB SAB Ect Mult Living   5 3 3  1  1   3        Patient's last menstrual period was 10/10/2012.    The following portions of the patient's history were reviewed and updated as appropriate: allergies, current medications, past family history, past medical history, past social history, past surgical history and problem list.  Review of Systems Pertinent items are noted in HPI.   Objective:    BP 98/70  Temp(Src) 98.3 F (36.8 C)  Wt 85.276 kg (188 lb)  BMI 31.28 kg/m2  LMP 10/10/2012 FHT: 140s BPM  Uterine Size: 38 cm and size equals dates  Presentations: cephalic  Pelvic Exam:                          Assessment:  Virginia Sawyer is a 37 y.o. [id number] at [redacted]w[redacted]d here for ROB  Plan:      Back pain: will give flexeril for spasm to see if helps.   Discussed with Patient:  - Plans to breast feed.  All questions answered. - Continue prenatal vitamins. - Reviewed fetal kick counts Pt to perform daily at a time when the baby is active, lie laterally with both hands on belly in quiet room and count all movements (hiccups, shoulder rolls, obvious kicks, etc); pt is to report to clinic MAU for less than 10 movements felt in a 2 hour time period-pt told as soon as she counts 10 movements the count is complete.  - Routine precautions discussed (depression, infection s/s).   Patient provided with all pertinent phone numbers for emergencies. - RTC for any VB, regular, painful cramps/ctxs occurring at a rate of >2/10 min, fever (100.5 or higher), n/v/d, any pain that is  unresolving or worsening, LOF, decreased fetal movement, CP, SOB, edema -RTC in one week for next visit. - collected GBS today - gollected GC/C  Problems: Patient Active Problem List   Diagnosis Date Noted  . Abnormal fetal ultrasound 02/07/2013  . Advanced maternal age in pregnancy 01/28/2013  . Acne 12/31/2012  . Supervision of normal pregnancy 12/31/2012  . Seasonal affective disorder 01/24/2012  . Migraines 01/24/2012  . 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   [ ]  Vaccines: Flu:  Tdap: Rec'd [ ]  BCM: IUD [ ]  Readiness: baby has a place to sleep, car seat, other baby necessities.  Edu: [x ] PTL precautions; [ ]  BF class; [ ]  childbirth class; [ ]   BF counseling;

## 2013-06-17 NOTE — Progress Notes (Signed)
Pulse- 80   Pain/pressure-vaginal, lower back

## 2013-06-17 NOTE — Addendum Note (Signed)
Addended by: Franchot Mimes on: 06/17/2013 08:40 AM   Modules accepted: Orders

## 2013-06-18 LAB — GC/CHLAMYDIA PROBE AMP
CT Probe RNA: NEGATIVE
GC Probe RNA: NEGATIVE

## 2013-06-23 ENCOUNTER — Encounter: Payer: Self-pay | Admitting: Family Medicine

## 2013-06-24 ENCOUNTER — Ambulatory Visit (INDEPENDENT_AMBULATORY_CARE_PROVIDER_SITE_OTHER): Payer: Medicaid Other | Admitting: Family Medicine

## 2013-06-24 ENCOUNTER — Encounter: Payer: Self-pay | Admitting: Family Medicine

## 2013-06-24 VITALS — BP 103/64 | Temp 97.3°F | Wt 191.5 lb

## 2013-06-24 DIAGNOSIS — O09529 Supervision of elderly multigravida, unspecified trimester: Secondary | ICD-10-CM

## 2013-06-24 DIAGNOSIS — O09523 Supervision of elderly multigravida, third trimester: Secondary | ICD-10-CM

## 2013-06-24 LAB — POCT URINALYSIS DIP (DEVICE)
Bilirubin Urine: NEGATIVE
pH: 5.5 (ref 5.0–8.0)

## 2013-06-24 MED ORDER — SINUS RINSE BOTTLE KIT NA PACK: 1.0000 | PACK | Freq: Two times a day (BID) | NASAL | Status: DC

## 2013-06-24 NOTE — Progress Notes (Signed)
Virginia Sawyer is a 37 y.o. [id number] at [redacted]w[redacted]d  here for ROB visit. Negative (09/03 0000)  Seen in MAU on Sunday for sinus infection. Has been using afrin and amoxicillin. Monday felt chills but since resolved. States have occassional ctx, No lof no VB nml Fetal movement.  Pt states that she is still congested with sinus pressure, and the only thing helping in afrin.   Desires waterbirth Consent today  Sinus infection Continue amoxicillin Will rx a sinus rinse kit  Discussed with Patient:  - Plans to breast/bottle feed.  All questions answered. - Continue prenatal vitamins. - Reviewed fetal kick counts Pt to perform daily at a time when the baby is active, lie laterally with both hands on belly in quiet room and count all movements (hiccups, shoulder rolls, obvious kicks, etc); pt is to report to clinic MAU for less than 10 movements felt in a 2 hour time period-pt told as soon as she counts 10 movements the count is complete.  - Routine precautions discussed (depression, infection s/s).   Patient provided with all pertinent phone numbers for emergencies. - RTC for any VB, regular, painful cramps/ctxs occurring at a rate of >2/10 min, fever (100.5 or higher), n/v/d, any pain that is unresolving or worsening, LOF, decreased fetal movement, CP, SOB, edema -RTC in one week for next visit.  Problems: Patient Active Problem List   Diagnosis Date Noted  . Abnormal fetal ultrasound 02/07/2013  . Advanced maternal age in pregnancy 01/28/2013  . Acne 12/31/2012  . Supervision of normal pregnancy 12/31/2012  . Seasonal affective disorder 01/24/2012  . Migraines 01/24/2012  . 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 Consent for water birth  [ ]  Vaccines: Flu:  Tdap: recd [ ]  BCM: mirena [ ]  Readiness: baby has a place to sleep, car seat, other baby necessities.  Edu: [x ] TL precautions; [ ]  BF class; [ ]  childbirth class; [ ]   BF counseling;

## 2013-06-24 NOTE — Progress Notes (Signed)
Pulse: 89 Pt not feeling well. Has cough and nasal congestion. Also having ear and eye pain. Currently taking Amoxicillin 500mg  bid

## 2013-06-25 ENCOUNTER — Encounter: Payer: Self-pay | Admitting: Family

## 2013-06-29 ENCOUNTER — Encounter: Payer: Self-pay | Admitting: *Deleted

## 2013-06-30 ENCOUNTER — Encounter: Payer: Self-pay | Admitting: *Deleted

## 2013-07-01 ENCOUNTER — Encounter: Payer: Self-pay | Admitting: Advanced Practice Midwife

## 2013-07-01 ENCOUNTER — Ambulatory Visit (INDEPENDENT_AMBULATORY_CARE_PROVIDER_SITE_OTHER): Payer: Medicaid Other | Admitting: Advanced Practice Midwife

## 2013-07-01 VITALS — BP 115/74 | Temp 97.1°F | Wt 191.2 lb

## 2013-07-01 DIAGNOSIS — Z3493 Encounter for supervision of normal pregnancy, unspecified, third trimester: Secondary | ICD-10-CM

## 2013-07-01 DIAGNOSIS — O09529 Supervision of elderly multigravida, unspecified trimester: Secondary | ICD-10-CM

## 2013-07-01 LAB — POCT URINALYSIS DIP (DEVICE)
Glucose, UA: NEGATIVE mg/dL
Leukocytes, UA: NEGATIVE
Nitrite: NEGATIVE
Urobilinogen, UA: 0.2 mg/dL (ref 0.0–1.0)

## 2013-07-01 NOTE — Progress Notes (Signed)
Doing well but pubic bone hurts.  Membranes swept. Pelvis proven to 9lb but this baby (diff father) feels about 7-7.5lbs.  Plans waterbirth. Consent found under media.

## 2013-07-01 NOTE — Progress Notes (Signed)
P=72. Patient states has been having contractions for the last 3 days with runs lasting up to3- 8 hours of 10 minutes apart. And painful.

## 2013-07-01 NOTE — Patient Instructions (Signed)
Vaginal Delivery  Your caregiver must first be sure you are in labor. Signs of labor include:   You may pass what is called "the mucus plug" before labor begins. This is a small amount of blood stained mucus.   Regular uterine contractions.   The time between contractions get closer together.   The discomfort and pain gradually gets more intense.   Pains are mostly located in the back.   Pains get worse when walking.   The cervix (the opening of the uterus) becomes thinner (begins to efface) and opens up (dilates).  Once you are in labor and admitted into the hospital or care center, your caregiver will do the following:   A complete physical examination.   Check your vital signs (blood pressure, pulse, temperature and the fetal heart rate).   Do a vaginal examination (using a sterile glove and lubricant) to determine:   The position (presentation) of the baby (head [vertex] or buttock first).   The level (station) of the baby's head in the birth canal.   The effacement and dilatation of the cervix.   You may have your pubic hair shaved and be given an enema depending on your caregiver and the circumstance.   An electronic monitor is usually placed on your abdomen. The monitor follows the length and intensity of the contractions, as well as the baby's heart rate.   Usually, your caregiver will insert an IV in your arm with a bottle of sugar water. This is done as a precaution so that medications can be given to you quickly during labor or delivery.  NORMAL LABOR AND DELIVERY IS DIVIDED UP INTO 3 STAGES:  First Stage  This is when regular contractions begin and the cervix begins to efface and dilate. This stage can last from 3 to 15 hours. The end of the first stage is when the cervix is 100% effaced and 10 centimeters dilated. Pain medications may be given by    Injection (morphine, demerol, etc.)    Regional anesthesia (spinal, caudal or epidural, anesthetics given in different locations of the spine). Paracervical pain medication may be given, which is an injection of and anesthetic on each side of the cervix.  A pregnant woman may request to have "Natural Childbirth" which is not to have any medications or anesthesia during her labor and delivery.  Second Stage  This is when the baby comes down through the birth canal (vagina) and is born. This can take 1 to 4 hours. As the baby's head comes down through the birth canal, you may feel like you are going to have a bowel movement. You will get the urge to bear down and push until the baby is delivered. As the baby's head is being delivered, the caregiver will decide if an episiotomy (a cut in the perineum and vagina area) is needed to prevent tearing of the tissue in this area. The episiotomy is sewn up after the delivery of the baby and placenta. Sometimes a mask with nitrous oxide is given for the mother to breath during the delivery of the baby to help if there is too much pain. The end of Stage 2 is when the baby is fully delivered. Then when the umbilical cord stops pulsating it is clamped and cut.  Third Stage  The third stage begins after the baby is completely delivered and ends after the placenta (afterbirth) is delivered. This usually takes 5 to 30 minutes. After the placenta is delivered, a medication is given   either by intravenous or injection to help contract the uterus and prevent bleeding. The third stage is not painful and pain medication is usually not necessary. If an episiotomy was done, it is repaired at this time.  After the delivery, the mother is watched and monitored closely for 1 to 2 hours to make sure there is no postpartum bleeding (hemorrhage). If there is a lot of bleeding, medication is given to contract the uterus and stop the bleeding.  Document Released: 07/10/2008 Document Revised: 06/25/2012 Document Reviewed: 07/10/2008   ExitCare Patient Information 2014 [employer].

## 2013-07-07 ENCOUNTER — Encounter: Payer: Self-pay | Admitting: *Deleted

## 2013-07-08 ENCOUNTER — Encounter: Payer: Self-pay | Admitting: Advanced Practice Midwife

## 2013-07-08 ENCOUNTER — Ambulatory Visit (INDEPENDENT_AMBULATORY_CARE_PROVIDER_SITE_OTHER): Payer: Medicaid Other | Admitting: Advanced Practice Midwife

## 2013-07-08 VITALS — BP 112/76 | Temp 97.0°F | Wt 195.2 lb

## 2013-07-08 DIAGNOSIS — O09529 Supervision of elderly multigravida, unspecified trimester: Secondary | ICD-10-CM

## 2013-07-08 DIAGNOSIS — Z3493 Encounter for supervision of normal pregnancy, unspecified, third trimester: Secondary | ICD-10-CM

## 2013-07-08 LAB — POCT URINALYSIS DIP (DEVICE)
Bilirubin Urine: NEGATIVE
Glucose, UA: NEGATIVE mg/dL
Nitrite: NEGATIVE

## 2013-07-08 NOTE — Progress Notes (Signed)
Pulse: 79 Patient has lots of pelvic pain and pressure as well as irregular contractions.

## 2013-07-08 NOTE — Patient Instructions (Addendum)
Vaginal Delivery Your caregiver must first be sure you are in labor. Signs of labor include:  You may pass what is called "the mucus plug" before labor begins. This is a small amount of blood stained mucus.  Regular uterine contractions.  The time between contractions get closer together.  The discomfort and pain gradually gets more intense.  Pains are mostly located in the back.  Pains get worse when walking.  The cervix (the opening of the uterus) becomes thinner (begins to efface) and opens up (dilates). Once you are in labor and admitted into the hospital or care center, your caregiver will do the following:  A complete physical examination.  Check your vital signs (blood pressure, pulse, temperature and the fetal heart rate).  Do a vaginal examination (using a sterile glove and lubricant) to determine:  The position (presentation) of the baby (head [vertex] or buttock first).  The level (station) of the baby's head in the birth canal.  The effacement and dilatation of the cervix.  An electronic monitor is usually placed on your abdomen. The monitor follows the length and intensity of the contractions, as well as the baby's heart rate.  Usually, your caregiver will insert an IV in your arm with a bottle of sugar water. This is done as a precaution so that medications can be given to you quickly during labor or delivery. NORMAL LABOR AND DELIVERY IS DIVIDED UP INTO 3 STAGES: First Stage This is when regular contractions begin and the cervix begins to efface and dilate. This stage can last from 3 to 15 hours. The end of the first stage is when the cervix is 100% effaced and 10 centimeters dilated. Pain medications may be given by   Injection (morphine, demerol, etc.)  Regional anesthesia (spinal, caudal or epidural, anesthetics given in different locations of the spine). Paracervical pain medication may be given, which is an injection of and anesthetic on each side of the  cervix. A pregnant woman may request to have "Natural Childbirth" which is not to have any medications or anesthesia during her labor and delivery. Second Stage This is when the baby comes down through the birth canal (vagina) and is born. This can take 1 to 4 hours. As the baby's head comes down through the birth canal, you may feel like you are going to have a bowel movement. You will get the urge to bear down and push until the baby is delivered. As the baby's head is being delivered, the caregiver will decide if an episiotomy (a cut in the perineum and vagina area) is needed to prevent tearing of the tissue in this area. The episiotomy is sewn up after the delivery of the baby and placenta. Sometimes a mask with nitrous oxide is given for the mother to breath during the delivery of the baby to help if there is too much pain. The end of Stage 2 is when the baby is fully delivered. Then when the umbilical cord stops pulsating it is clamped and cut. Third Stage The third stage begins after the baby is completely delivered and ends after the placenta (afterbirth) is delivered. This usually takes 5 to 30 minutes. After the placenta is delivered, a medication is given either by intravenous or injection to help contract the uterus and prevent bleeding. The third stage is not painful and pain medication is usually not necessary. If an episiotomy was done, it is repaired at this time. After the delivery, the mother is watched and monitored closely for   1 to 2 hours to make sure there is no postpartum bleeding (hemorrhage). If there is a lot of bleeding, medication is given to contract the uterus and stop the bleeding. Document Released: 07/10/2008 Document Revised: 06/25/2012 Document Reviewed: 07/10/2008 Exit[employer] Patient Information 2014 [employer].  

## 2013-07-08 NOTE — Progress Notes (Signed)
Ready to have this baby!  Membranes swept again. Cervix a bit more effaced. Will schedule IOL for Tuesday at 0730.  EFW about 7-7.5lbs

## 2013-07-09 ENCOUNTER — Telehealth (HOSPITAL_COMMUNITY): Payer: Self-pay | Admitting: *Deleted

## 2013-07-09 ENCOUNTER — Encounter (HOSPITAL_COMMUNITY): Payer: Self-pay | Admitting: *Deleted

## 2013-07-09 NOTE — Telephone Encounter (Signed)
Preadmission screen  

## 2013-07-14 ENCOUNTER — Inpatient Hospital Stay (HOSPITAL_COMMUNITY)
Admission: RE | Admit: 2013-07-14 | Discharge: 2013-07-17 | DRG: 775 | Disposition: A | Discharge status: Home / Self Care | Payer: Medicaid Other | Source: Ambulatory Visit | Attending: Family Medicine | Admitting: Family Medicine

## 2013-07-14 ENCOUNTER — Encounter (HOSPITAL_COMMUNITY): Payer: Self-pay

## 2013-07-14 VITALS — BP 92/49 | HR 69 | Temp 97.5°F | Resp 19 | Ht 65.0 in | Wt 193.0 lb

## 2013-07-14 DIAGNOSIS — N871 Moderate cervical dysplasia: Secondary | ICD-10-CM | POA: Diagnosis present

## 2013-07-14 DIAGNOSIS — IMO0002 Reserved for concepts with insufficient information to code with codable children: Secondary | ICD-10-CM | POA: Diagnosis present

## 2013-07-14 DIAGNOSIS — O480 Post-term pregnancy: Principal | ICD-10-CM | POA: Diagnosis present

## 2013-07-14 DIAGNOSIS — O09523 Supervision of elderly multigravida, third trimester: Secondary | ICD-10-CM

## 2013-07-14 DIAGNOSIS — O358XX0 Maternal care for other (suspected) fetal abnormality and damage, not applicable or unspecified: Secondary | ICD-10-CM | POA: Diagnosis present

## 2013-07-14 DIAGNOSIS — O09529 Supervision of elderly multigravida, unspecified trimester: Secondary | ICD-10-CM | POA: Diagnosis present

## 2013-07-14 LAB — CBC
HCT: 30.7 % — ABNORMAL LOW (ref 36.0–46.0)
Hemoglobin: 10.4 g/dL — ABNORMAL LOW (ref 12.0–15.0)
MCH: 29 pg (ref 26.0–34.0)
MCV: 85.5 fL (ref 78.0–100.0)
Platelets: 140 10*3/uL — ABNORMAL LOW (ref 150–400)
RBC: 3.59 MIL/uL — ABNORMAL LOW (ref 3.87–5.11)
WBC: 8.4 10*3/uL (ref 4.0–10.5)

## 2013-07-14 LAB — RPR: RPR Ser Ql: NONREACTIVE

## 2013-07-14 MED ORDER — ACETAMINOPHEN 325 MG PO TABS
650.0000 mg | ORAL_TABLET | ORAL | Status: DC | PRN
  Administered 2013-07-14: 650 mg via ORAL
  Filled 2013-07-14: qty 2

## 2013-07-14 MED ORDER — FLEET ENEMA 7-19 GM/118ML RE ENEM: 1.0000 | ENEMA | RECTAL | Status: DC | PRN

## 2013-07-14 MED ORDER — IBUPROFEN 600 MG PO TABS
600.0000 mg | ORAL_TABLET | Freq: Four times a day (QID) | ORAL | Status: DC | PRN
  Administered 2013-07-15: 600 mg via ORAL
  Filled 2013-07-14: qty 1

## 2013-07-14 MED ORDER — CITRIC ACID-SODIUM CITRATE 334-500 MG/5ML PO SOLN: 30.0000 mL | ORAL | Status: DC | PRN

## 2013-07-14 MED ORDER — ZOLPIDEM TARTRATE 5 MG PO TABS: 5.0000 mg | ORAL_TABLET | Freq: Every evening | ORAL | Status: DC | PRN

## 2013-07-14 MED ORDER — LIDOCAINE HCL (PF) 1 % IJ SOLN
30.0000 mL | INTRAMUSCULAR | Status: DC | PRN
  Filled 2013-07-14: qty 30

## 2013-07-14 MED ORDER — BUTALBITAL-APAP-CAFFEINE 50-325-40 MG PO TABS
1.0000 | ORAL_TABLET | Freq: Once | ORAL | Status: AC
  Administered 2013-07-14: 1 via ORAL
  Filled 2013-07-14: qty 1

## 2013-07-14 MED ORDER — ONDANSETRON HCL 4 MG/2ML IJ SOLN: 4.0000 mg | Freq: Four times a day (QID) | INTRAMUSCULAR | Status: DC | PRN

## 2013-07-14 MED ORDER — LACTATED RINGERS IV SOLN
INTRAVENOUS | Status: DC
  Administered 2013-07-14 (×2): via INTRAVENOUS

## 2013-07-14 MED ORDER — OXYCODONE-ACETAMINOPHEN 5-325 MG PO TABS
1.0000 | ORAL_TABLET | ORAL | Status: DC | PRN
Start: 2013-07-14 — End: 2013-07-15

## 2013-07-14 MED ORDER — OXYTOCIN 40 UNITS IN LACTATED RINGERS INFUSION - SIMPLE MED
62.5000 mL/h | INTRAVENOUS | Status: DC
  Filled 2013-07-14: qty 1000

## 2013-07-14 MED ORDER — OXYTOCIN 40 UNITS IN LACTATED RINGERS INFUSION - SIMPLE MED
1.0000 m[IU]/min | INTRAVENOUS | Status: DC
  Administered 2013-07-14: 1 m[IU]/min via INTRAVENOUS

## 2013-07-14 MED ORDER — OXYTOCIN BOLUS FROM INFUSION
500.0000 mL | INTRAVENOUS | Status: DC
  Administered 2013-07-15: 500 mL via INTRAVENOUS

## 2013-07-14 MED ORDER — LACTATED RINGERS IV SOLN: 500.0000 mL | INTRAVENOUS | Status: DC | PRN

## 2013-07-14 NOTE — Progress Notes (Signed)
   Subjective: Reports increase in sensation of contractions.  RN reports foley bulb has come out.    Objective: BP 101/68  Pulse 65  Resp 20  Ht 5\' 5"  (1.651 m)  Wt 87.544 kg (193 lb)  BMI 32.12 kg/m2  LMP 10/10/2012      FHT:  Doppler 145 UC:   5-6 (by observation) SVE:   Dilation: 5 Effacement (%): 60 Station: Ballotable Exam by:: [employer]: Lab Results  Component Value Date   WBC 8.4 07/14/2013   HGB 10.4* 07/14/2013   HCT 30.7* 07/14/2013   MCV 85.5 07/14/2013   PLT 140* 07/14/2013    Assessment / Plan: Augmentation of labor, progressing well  Labor: Progressing normally Preeclampsia:  n/a Fetal Wellbeing:  Category I Pain Control:  Labor support without medications I/D:  GBS neg Anticipated MOD:  NSVD Setting up watertub  [facility] 07/14/2013, 1:01 PM

## 2013-07-14 NOTE — Progress Notes (Signed)
Virginia Sawyer is a 37 y.o. [id number] at [redacted]w[redacted]d by admitted for induction of labor due to Post dates. Due date 07/09/13.  Subjective:  Coping well. Feeling some increased pressure, and would like to be checked so that she can get back into the tub.  Objective: BP 112/65  Pulse 80  Temp(Src) 98.3 F (36.8 C) (Oral)  Resp 18  Ht 5\' 5"  (1.651 m)  Wt 87.544 kg (193 lb)  BMI 32.12 kg/m2  LMP 10/10/2012      FHT:  FHR: 145 bpm, variability: moderate,  accelerations:  Present,  decelerations:  Absent UC:   regular, every 2-3 minutes SVE:   Dilation: 5.5 Effacement (%): 50 Station: -2 Exam by:: [employer] CNM  Labs: Lab Results  Component Value Date   WBC 8.4 07/14/2013   HGB 10.4* 07/14/2013   HCT 30.7* 07/14/2013   MCV 85.5 07/14/2013   PLT 140* 07/14/2013    Assessment / Plan: Induction of labor due to postterm,  progressing well on pitocin  Labor: Progressing normally Preeclampsia:  NA Fetal Wellbeing:  Category I Pain Control:  Labor support without medications and hydrotherapy  I/D:  n/a Anticipated MOD:  NSVD  [name] 07/14/2013, 10:11 PM

## 2013-07-14 NOTE — Progress Notes (Signed)
Virginia Sawyer is a 37 y.o. [id number] at [redacted]w[redacted]d by  admitted for induction of labor due to Post dates. Due date 07/09/13.  Subjective: Coping well. Family and doula supportive at bedside. Feeling some pressure with UCs.   Objective: BP 114/73  Pulse 76  Temp(Src) 98.3 F (36.8 C) (Oral)  Resp 18  Ht 5\' 5"  (1.651 m)  Wt 87.544 kg (193 lb)  BMI 32.12 kg/m2  LMP 10/10/2012      FHT:  FHR: 140 bpm, variability: moderate,  accelerations:  Present,  decelerations:  Absent UC:   regular, every 3 minutes SVE:   Dilation: 5.5 Effacement (%): 80 Station: -1 Exam by:: Muhammed  Labs: Lab Results  Component Value Date   WBC 8.4 07/14/2013   HGB 10.4* 07/14/2013   HCT 30.7* 07/14/2013   MCV 85.5 07/14/2013   PLT 140* 07/14/2013    Assessment / Plan: Induction of labor due to postterm,  progressing well on pitocin  Labor: Progressing normally Preeclampsia:  NA Fetal Wellbeing:  Category I Pain Control:  Labor support without medications and hydrotherapy as needed  I/D:  n/a Anticipated MOD:  NSVD  Virginia Sawyer 07/14/2013, 8:50 PM

## 2013-07-14 NOTE — H&P (Signed)
Attestation of Attending Supervision of Advanced Practitioner (CNM/NP): Evaluation and management procedures were performed by the Advanced Practitioner under my supervision and collaboration.  I have reviewed the Advanced Practitioner's note and chart, and I agree with the management and plan.  Yanice Maqueda 07/14/2013 5:07 PM

## 2013-07-14 NOTE — Progress Notes (Signed)
I examined pt and agree with documentation above and nurse midwife student plan of care. MUHAMMAD,Zykiria Bruening  

## 2013-07-14 NOTE — Progress Notes (Signed)
   Subjective: Pt reports increase in pain sensation.  No questions or concerns.  Reports comfort with being in tub.  Increased headache, not relieved by Tylenol.  Pt reports caffeine usual resolves.   Objective: BP 111/66  Pulse 70  Temp(Src) 97.8 F (36.6 C) (Oral)  Resp 20  Ht 5\' 5"  (1.651 m)  Wt 87.544 kg (193 lb)  BMI 32.12 kg/m2  LMP 10/10/2012      FHT: 145, mod variability, + acceleration with doppler. UC:   Regular 5-6 SVE:   Dilation: 6 Effacement (%): 90 Station: -1 Exam by:: sowder,RNC  Labs: Lab Results  Component Value Date   WBC 8.4 07/14/2013   HGB 10.4* 07/14/2013   HCT 30.7* 07/14/2013   MCV 85.5 07/14/2013   PLT 140* 07/14/2013    Assessment / Plan: Augmentation of Labor, minimal cervical change  Labor: Augmentation of labor, minimal cervical change.  Preeclampsia:  n/a Fetal Wellbeing:  Category I Pain Control:  Labor support without medications and watertub I/D:  GBS neg Anticipated MOD:  NSVD Begin pitocin augmentation, rupture when fetus lower in pelvis, may discontinue pitocin at that point.  [facility] 07/14/2013, 6:19 PM

## 2013-07-14 NOTE — Progress Notes (Signed)
Subjective: Patient relaxed and laboring in waterbirth pool. Tolerating contractions well.  Objective: BP 110/62  Pulse 90  Temp(Src) 97.6 F (36.4 C) (Oral)  Resp 20  Ht 5\' 5"  (1.651 m)  Wt 87.544 kg (193 lb)  BMI 32.12 kg/m2  LMP 10/10/2012      FHT:  150 per intermittent FHR monitoring UC:   Regular by observation SVE:   Dilation: 6 Effacement (%): 90 Station: -1 Exam by:: sowder,RNC  Labs: Lab Results  Component Value Date   WBC 8.4 07/14/2013   HGB 10.4* 07/14/2013   HCT 30.7* 07/14/2013   MCV 85.5 07/14/2013   PLT 140* 07/14/2013    Assessment / Plan: Induction of labor due to postterm,  progressing well on pitocin  Labor: Progressing normally Fetal Wellbeing:  Category I Pain Control:  Labor support without medications I/D:  n/a Anticipated MOD:  NSVD  Selena Lesser 07/14/2013, 4:05 PM

## 2013-07-14 NOTE — H&P (Signed)
Virginia Sawyer is a 37 y.o. female presenting for induction of labor secondary to dates/advanced maternal age.  Seen in [facility] clinic.  Dated by 13 week ultrasound.  Desires waterbirth.  Pregnancy uncomplicated other than fetal Grade IV Left Hydronephrosis, severely enlarged kidney, pelvis and ureter, presumed good kidney function based on AFI.  Approval received from NICU staff regarding waterbirth.    Maternal Medical History:  Contractions: Onset was 1 week ago.   Frequency: irregular.   Perceived severity is moderate.    Fetal activity: Perceived fetal activity is normal.   Last perceived fetal movement was within the past hour.      OB History   Grav Para Term Preterm Abortions TAB SAB Ect Mult Living   5 3 3  1  1   3      Past Medical History  Diagnosis Date  . Allergy   . [place] measles   . Sinus infection    Past Surgical History  Procedure Laterality Date  . Wisdom tooth extraction    . No past surgeries    . Lasix eye surgery      age 57  . Leep     Family History: family history includes Heart disease in her maternal grandfather; Vision loss in her maternal grandmother. There is no history of Alcohol abuse, Arthritis, Asthma, Birth defects, Cancer, COPD, Depression, Diabetes, Drug abuse, Hearing loss, Early death, Hyperlipidemia, Hypertension, Kidney disease, Mental illness, Learning disabilities, Mental retardation, Miscarriages / Stillbirths, or Stroke. Social History:  reports that she quit smoking about 8 months ago. Her smoking use included Cigarettes. She smoked 10.00 packs per day. She has never used smokeless tobacco. She reports that  drinks alcohol. She reports that she does not use illicit drugs.   Prenatal Transfer Tool  Maternal Diabetes: No Genetic Screening: Normal Maternal Ultrasounds/Referrals: Abnormal:  Findings:   Fetal Kidney Anomalies;  Grade IV Left Hydronephrosis, severely enlarged kidney, pelvis and ureter, presumed good kidney function based  on AFI. Fetal Ultrasounds or other Referrals:  Other: See NICU note. Maternal Substance Abuse:  No Significant Maternal Medications:  None Significant Maternal Lab Results:  Lab values include: Group B Strep negative Other Comments:  Advanced Maternal Age.  Review of Systems  Gastrointestinal: Positive for abdominal pain (intermittent contractions).      Blood pressure 106/71, pulse 79, resp. rate 20, height 5\' 5"  (1.651 m), weight 87.544 kg (193 lb), last menstrual period 10/10/2012. Maternal Exam:  Abdomen: Estimated fetal weight is 6.5-7lbs.   Fetal presentation: vertex  Introitus: Vagina is positive for vaginal discharge (mucusy).    Fetal Exam Fetal Monitor Review: Baseline rate: 135.  Variability: moderate (6-25 bpm).   Pattern: accelerations present.    Fetal State Assessment: Category I - tracings are normal.     Physical Exam  Constitutional: She is oriented to person, place, and time. She appears well-developed and well-nourished. No distress.  HENT:  Head: Normocephalic.  Neck: Normal range of motion. Neck supple.  Cardiovascular: Normal rate, regular rhythm and normal heart sounds.   Respiratory: Effort normal and breath sounds normal.  GI: Soft. There is no tenderness.  Genitourinary: No bleeding around the vagina. Vaginal discharge (mucusy) found.  Musculoskeletal: Normal range of motion. She exhibits edema (1+ bilat).  Neurological: She is alert and oriented to person, place, and time.  Skin: Skin is warm and dry.    Prenatal labs: ABO, Rh: A/POS/-- (03/19 1133) Antibody: NEG (03/19 1133) Rubella: 1.05 (03/19 1133) RPR: NON REAC (06/25  1201)  HBsAg: NEGATIVE (03/19 1133)  HIV: NON REACTIVE (06/25 1201)  GBS: Negative (09/03 0000)   Assessment/Plan: 37 yo G5P3013 at [redacted]w[redacted]d wks IUP Advanced Maternal Age GBS Neg Fetal Renal Hydronephrosis  Plan: Ad[facility] to [facility] Ezequiel Essex in to discuss fetal anomaly with patient Anticipate  NSVD   [facility] 07/14/2013, 10:09 AM

## 2013-07-15 ENCOUNTER — Encounter (HOSPITAL_COMMUNITY): Payer: Self-pay

## 2013-07-15 DIAGNOSIS — O358XX0 Maternal care for other (suspected) fetal abnormality and damage, not applicable or unspecified: Secondary | ICD-10-CM

## 2013-07-15 DIAGNOSIS — N871 Moderate cervical dysplasia: Secondary | ICD-10-CM

## 2013-07-15 DIAGNOSIS — IMO0002 Reserved for concepts with insufficient information to code with codable children: Secondary | ICD-10-CM

## 2013-07-15 DIAGNOSIS — O480 Post-term pregnancy: Secondary | ICD-10-CM

## 2013-07-15 MED ORDER — TETANUS-DIPHTH-ACELL PERTUSSIS 5-2.5-18.5 LF-MCG/0.5 IM SUSP: 0.5000 mL | Freq: Once | INTRAMUSCULAR | Status: DC

## 2013-07-15 MED ORDER — DIBUCAINE 1 % RE OINT
1.0000 "application " | TOPICAL_OINTMENT | RECTAL | Status: DC | PRN
  Administered 2013-07-16: 1 via RECTAL
  Filled 2013-07-15: qty 28

## 2013-07-15 MED ORDER — PRENATAL MULTIVITAMIN CH
1.0000 | ORAL_TABLET | Freq: Every day | ORAL | Status: DC
  Administered 2013-07-15 – 2013-07-17 (×3): 1 via ORAL
  Filled 2013-07-15 (×3): qty 1

## 2013-07-15 MED ORDER — WITCH HAZEL-GLYCERIN EX PADS
1.0000 "application " | MEDICATED_PAD | CUTANEOUS | Status: DC | PRN
  Administered 2013-07-16: 1 via TOPICAL

## 2013-07-15 MED ORDER — SIMETHICONE 80 MG PO CHEW: 80.0000 mg | CHEWABLE_TABLET | ORAL | Status: DC | PRN

## 2013-07-15 MED ORDER — BENZOCAINE-MENTHOL 20-0.5 % EX AERO
1.0000 "application " | INHALATION_SPRAY | CUTANEOUS | Status: DC | PRN
  Administered 2013-07-15 – 2013-07-16 (×2): 1 via TOPICAL
  Filled 2013-07-15 (×2): qty 56

## 2013-07-15 MED ORDER — ZOLPIDEM TARTRATE 5 MG PO TABS: 5.0000 mg | ORAL_TABLET | Freq: Every evening | ORAL | Status: DC | PRN

## 2013-07-15 MED ORDER — IBUPROFEN 600 MG PO TABS
600.0000 mg | ORAL_TABLET | Freq: Four times a day (QID) | ORAL | Status: DC
  Administered 2013-07-16 – 2013-07-17 (×7): 600 mg via ORAL
  Filled 2013-07-15 (×8): qty 1

## 2013-07-15 MED ORDER — SENNOSIDES-DOCUSATE SODIUM 8.6-50 MG PO TABS
2.0000 | ORAL_TABLET | ORAL | Status: DC
  Administered 2013-07-15 – 2013-07-17 (×2): 2 via ORAL

## 2013-07-15 MED ORDER — ONDANSETRON HCL 4 MG PO TABS: 4.0000 mg | ORAL_TABLET | ORAL | Status: DC | PRN

## 2013-07-15 MED ORDER — DIPHENHYDRAMINE HCL 25 MG PO CAPS: 25.0000 mg | ORAL_CAPSULE | Freq: Four times a day (QID) | ORAL | Status: DC | PRN

## 2013-07-15 MED ORDER — OXYCODONE-ACETAMINOPHEN 5-325 MG PO TABS
1.0000 | ORAL_TABLET | ORAL | Status: DC | PRN
  Administered 2013-07-15 – 2013-07-17 (×8): 1 via ORAL
  Administered 2013-07-17: 2 via ORAL
  Filled 2013-07-15 (×3): qty 1
  Filled 2013-07-15: qty 2
  Filled 2013-07-15 (×3): qty 1
  Filled 2013-07-15: qty 2
  Filled 2013-07-15: qty 1

## 2013-07-15 MED ORDER — LANOLIN HYDROUS EX OINT: TOPICAL_OINTMENT | CUTANEOUS | Status: DC | PRN

## 2013-07-15 MED ORDER — ONDANSETRON HCL 4 MG/2ML IJ SOLN: 4.0000 mg | INTRAMUSCULAR | Status: DC | PRN

## 2013-07-15 MED ORDER — BUTALBITAL-APAP-CAFFEINE 50-325-40 MG PO TABS
2.0000 | ORAL_TABLET | Freq: Once | ORAL | Status: AC
  Administered 2013-07-15: 2 via ORAL
  Filled 2013-07-15: qty 2

## 2013-07-15 NOTE — Lactation Note (Signed)
This note was copied from the chart of Virginia Sawyer. Lactation Consultation Note  Patient Name: Virginia Sawyer Today's Date: 07/15/2013 Reason for consult: Initial assessment Mom is experienced BF. Baby last CBG was low. Baby asleep and not interested in BF at this visit. Offered to assist Mom with hand expression to give baby some colostrum. Mom reports she knows how to hand express, declined assist. Placed baby STS on Mom's chest and encouraged Mom to keep baby STS till next CBG. Reviewed with Mom that this helps stabilize blood sugar. Advised Mom to BF with feeding ques, at least every 3 hours. If baby will not wake to BF, then we need to hand express and finger or spoon feed baby some colostrum with next feeding or if blood sugar does not improve. Lactation brochure left for review. Advised of OP services and support group. Advised Mom to call if needs assist.   Maternal Data Formula Feeding for Exclusion: No Infant to breast within first hour of birth: Yes Has patient been taught Hand Expression?: No (pt declined demonstration reporting she knows how to hand ex) Does the patient have breastfeeding experience prior to this delivery?: Yes  Feeding Feeding Type: Breast Milk  LATCH Score/Interventions                      Lactation Tools Discussed/Used     Consult Status Consult Status: Follow-up Date: 07/16/13 Follow-up type: In-patient    Alfred Levins 07/15/2013, 12:30 PM

## 2013-07-15 NOTE — Progress Notes (Signed)
Virginia Sawyer is a 37 y.o. [id number] at [redacted]w[redacted]d  admitted for IOL 2/2 postdates  Subjective:  Feeling very tired, and requesting that we "do something different to help". Does not want pain medication at this time.   Objective: BP 114/70  Pulse 88  Temp(Src) 98.6 F (37 C) (Oral)  Resp 18  Ht 5\' 5"  (1.651 m)  Wt 87.544 kg (193 lb)  BMI 32.12 kg/m2  LMP 10/10/2012      FHT:  FHR: 145 bpm, variability: moderate,  accelerations:  Present,  decelerations:  Absent UC:   regular, every 2-3 minutes SVE:   Dilation: 6 Effacement (%): 70 Station: -1 Exam by:: [employer] CNM  Labs: Lab Results  Component Value Date   WBC 8.4 07/14/2013   HGB 10.4* 07/14/2013   HCT 30.7* 07/14/2013   MCV 85.5 07/14/2013   PLT 140* 07/14/2013    Assessment / Plan: Induction of labor due to postterm,  progressing well on pitocin  Labor: Progressing normally and Will AROM now Preeclampsia:  NA Fetal Wellbeing:  Category I Pain Control:  Labor support without medications I/D:  n/a Anticipated MOD:  NSVD  [name] 07/15/2013, 2:06 AM

## 2013-07-15 NOTE — Progress Notes (Signed)
UR chart review completed.  

## 2013-07-16 ENCOUNTER — Encounter: Payer: Self-pay | Admitting: Family Medicine

## 2013-07-16 ENCOUNTER — Encounter (HOSPITAL_COMMUNITY): Payer: Self-pay

## 2013-07-16 MED ORDER — INFLUENZA VAC SPLIT QUAD 0.5 ML IM SUSP
0.5000 mL | INTRAMUSCULAR | Status: AC
  Administered 2013-07-16: 0.5 mL via INTRAMUSCULAR
  Filled 2013-07-16: qty 0.5

## 2013-07-16 NOTE — Progress Notes (Signed)
Post Partum Day 1.  Subjective: no complaints, voiding, tolerating PO and + flatus Patient says her vaginal bleeding has markedly improved.  She says there is only a "spot" on the pad now, every few hours. She says her abdomen is cramping for which she is taking PO ibuprofen and percocet.   Objective: Blood pressure 96/66, pulse 99, temperature 98.6 F (37 C), temperature source Oral, resp. rate 18, height 5\' 5"  (1.651 m), weight 87.544 kg (193 lb), last menstrual period 10/10/2012, SpO2 98.00%, unknown if currently breastfeeding.  Physical Exam:  General: alert, cooperative, appears stated age and no distress Lochia: appropriate Uterine Fundus: firm DVT Evaluation: No evidence of DVT seen on physical exam. Negative Homan's sign. No cords or calf tenderness. No significant calf/ankle edema.   Recent Labs  07/14/13 0850  HGB 10.4*  HCT 30.7*    Assessment/Plan: Plan for discharge tomorrow, Discharge home, Breastfeeding and Contraception Mirena.   LOS: 2 days   Bing Plume 07/16/2013, 7:28 AM   I have seen and examined this patient and agree with above documentation in the PA student's note.   Rulon Abide, M.D. [facility] Fellow 07/16/2013 10:44 AM

## 2013-07-16 NOTE — Lactation Note (Signed)
This note was copied from the chart of Virginia Mushka Heavin. Lactation Consultation Note MBU RN asked LC to come to room to answer moms questions.  Baby at left breast finishing feeding.   Mom has multiple questions regarding foods that produce gas, urine and voids being adequate and fussiness.  Discussed at length with mom and dad to reassure them.  Baby has had great feedings and output today.  Mom reports baby had a kidney blockage noted on ultrasound and will be followed up inpatient.  Mom to ask questions to pediatrician regarding additional concerns.    Patient Name: Virginia Sawyer Today's Date: 07/16/2013 Reason for consult: Follow-up assessment   Maternal Data    Feeding Feeding Type: Breast Milk Length of feed: 25 min  LATCH Score/Interventions Latch: Grasps breast easily, tongue down, lips flanged, rhythmical sucking.  Audible Swallowing: Spontaneous and intermittent (per mom)  Type of Nipple: Everted at rest and after stimulation  Comfort (Breast/Nipple): Soft / non-tender     Hold (Positioning): No assistance needed to correctly position infant at breast.  LATCH Score: 10  Lactation Tools Discussed/Used     Consult Status Consult Status: Follow-up Date: 07/17/13 Follow-up type: In-patient    Warrick Parisian [facility] 07/16/2013, 9:43 PM

## 2013-07-17 MED ORDER — SENNOSIDES-DOCUSATE SODIUM 8.6-50 MG PO TABS: 2.0000 | ORAL_TABLET | ORAL | Status: DC

## 2013-07-17 MED ORDER — DOCUSATE SODIUM 100 MG PO CAPS: 100.0000 mg | ORAL_CAPSULE | Freq: Two times a day (BID) | ORAL | Status: DC

## 2013-07-17 MED ORDER — IBUPROFEN 200 MG PO TABS: 200.0000 mg | ORAL_TABLET | Freq: Four times a day (QID) | ORAL | Status: AC | PRN

## 2013-07-17 MED ORDER — OXYCODONE-ACETAMINOPHEN 5-325 MG PO TABS: 1.0000 | ORAL_TABLET | ORAL | Status: DC | PRN

## 2013-07-17 MED ORDER — OXYCODONE-ACETAMINOPHEN 5-325 MG PO TABS
1.0000 | ORAL_TABLET | ORAL | Status: DC | PRN
Start: 2013-07-17 — End: 2014-08-30

## 2013-07-17 NOTE — Discharge Summary (Signed)
Obstetric Discharge Summary Reason for Admission: induction of labor and Post dates Prenatal Procedures: ultrasound with infant having hydronephrosis, ABNORMAL PAP REQUIRING REPEAT POSTPARTUM Intrapartum Procedures: spontaneous vaginal delivery and water birth without complication Postpartum Procedures: none Complications-Operative and Postpartum: none Hemoglobin  Date Value Range Status  07/14/2013 10.4* 12.0 - 15.0 g/dL Final     HCT  Date Value Range Status  07/14/2013 30.7* 36.0 - 46.0 % Final    Physical Exam:  General: alert, cooperative, appears stated age and no distress Lochia: appropriate Uterine Fundus: Firm U-1 RRR no mgt CTAB no wrc DVT Evaluation: No evidence of DVT seen on physical exam. Negative Homan's sign. No cords or calf tenderness. No significant calf/ankle edema.  Discharge Diagnoses: Post-date pregnancy  Discharge Information: Date: 07/17/2013 Activity: pelvic rest Diet: routine Medications: PNV, Ibuprofen, Colace and Percocet Condition: stable Instructions: refer to practice specific booklet Discharge to: home Follow-up Information   Follow up with [facility] In 4 weeks. (Need for repeat PAP, PP visit, and Birth control planning)    Specialty:  Obstetrics and Gynecology   Contact information:   [address] [place] [address] [phone]    MOC: Desires mirena after PAP/Colpo MOF: Breast  !!!ABNORMAL PAP SMEAR/COLPO - CIN II - Needs repeat Pap !!!  Newborn Data: Live born female  Birth Weight: 9 lb 6.3 oz (4261 g) APGAR: 8, 9  Home with mother.  Tawana Scale 07/17/2013, 9:29 AM

## 2013-07-20 NOTE — Discharge Summary (Signed)
Attestation of Attending Supervision of Obstetric Fellow: Evaluation and management procedures were performed by the Obstetric Fellow under my supervision and collaboration.  I have reviewed the Obstetric Fellow's note and chart, and I agree with the management and plan.  Crosley Stejskal, MD, FACOG Attending Obstetrician & Gynecologist Faculty Practice, [facility]    

## 2013-08-12 ENCOUNTER — Encounter: Payer: Self-pay | Admitting: Obstetrics & Gynecology

## 2013-08-12 ENCOUNTER — Ambulatory Visit (INDEPENDENT_AMBULATORY_CARE_PROVIDER_SITE_OTHER): Payer: Medicaid Other | Admitting: Obstetrics & Gynecology

## 2013-08-12 ENCOUNTER — Other Ambulatory Visit (HOSPITAL_COMMUNITY)
Admission: RE | Admit: 2013-08-12 | Discharge: 2013-08-12 | Disposition: A | Discharge status: Home / Self Care | Payer: Medicaid Other | Source: Ambulatory Visit | Attending: Obstetrics & Gynecology | Admitting: Obstetrics & Gynecology

## 2013-08-12 VITALS — BP 113/78 | HR 69 | Ht 65.0 in | Wt 169.8 lb

## 2013-08-12 DIAGNOSIS — Z1151 Encounter for screening for human papillomavirus (HPV): Secondary | ICD-10-CM | POA: Insufficient documentation

## 2013-08-12 DIAGNOSIS — Z01812 Encounter for preprocedural laboratory examination: Secondary | ICD-10-CM

## 2013-08-12 DIAGNOSIS — Z01419 Encounter for gynecological examination (general) (routine) without abnormal findings: Secondary | ICD-10-CM | POA: Insufficient documentation

## 2013-08-12 DIAGNOSIS — IMO0002 Reserved for concepts with insufficient information to code with codable children: Secondary | ICD-10-CM

## 2013-08-12 DIAGNOSIS — R87613 High grade squamous intraepithelial lesion on cytologic smear of cervix (HGSIL): Secondary | ICD-10-CM

## 2013-08-12 DIAGNOSIS — D069 Carcinoma in situ of cervix, unspecified: Secondary | ICD-10-CM | POA: Insufficient documentation

## 2013-08-12 DIAGNOSIS — R87810 Cervical high risk human papillomavirus (HPV) DNA test positive: Secondary | ICD-10-CM | POA: Insufficient documentation

## 2013-08-12 LAB — CBC
HCT: 38.8 % (ref 36.0–46.0)
Hemoglobin: 13.4 g/dL (ref 12.0–15.0)
MCH: 29.1 pg (ref 26.0–34.0)
MCHC: 34.5 g/dL (ref 30.0–36.0)
Platelets: 256 10*3/uL (ref 150–400)
RBC: 4.61 MIL/uL (ref 3.87–5.11)
WBC: 6.1 10*3/uL (ref 4.0–10.5)

## 2013-08-12 LAB — POCT PREGNANCY, URINE: Preg Test, Ur: NEGATIVE

## 2013-08-12 NOTE — Patient Instructions (Signed)
Colposcopy Care After Colposcopy is a procedure in which a special tool is used to magnify the surface of the cervix. A tissue sample (biopsy) may also be taken. This sample will be looked at for cervical cancer or other problems. After the test:  You may have some cramping.  Lie down for a few minutes if you feel lightheaded.   You may have some bleeding which should stop in a few days. HOME CARE  Do not have sex or use tampons for 2 to 3 days or as told.  Only take medicine as told by your doctor.  Continue to take your birth control pills as usual. Finding out the results of your test Ask when your test results will be ready. Make sure you get your test results. GET HELP RIGHT AWAY IF:  You are bleeding a lot or are passing blood clots.  You develop a fever of 102 F (38.9 C) or higher.  You have abnormal vaginal discharge.  You have cramps that do not go away with medicine.  You feel lightheaded, dizzy, or pass out (faint). MAKE SURE YOU:   Understand these instructions.  Will watch your condition.  Will get help right away if you are not doing well or get worse. Document Released: 03/19/2008 Document Revised: 12/24/2011 Document Reviewed: 03/19/2008 [facility] Patient Information 2014 [place], [place]. Loop Electrosurgical Excision Procedure Loop electrosurgical excision procedure (LEEP) is the removal of a portion of the lower part of the uterus (cervix). The procedure is done when there are significantly abnormal cervical cell changes. Abnormal cell changes of the cervix can lead to cancer if left in place and untreated.  The LEEP procedure itself typically only takes a few minutes. Often, it may be done in your caregiver's office. The procedure is considered safe for those who wish to get pregnant or are trying to get pregnant. Only under rare circumstances should this procedure be done if you are pregnant. LET YOUR CAREGIVER KNOW ABOUT:  Whether you are pregnant or  late for your last menstrual period.  Allergies to foods or medicines.  All the medicines you are taking includingherbs, eyedrops, and over-the-counter medicines, and creams.  Use of steroids (by mouth or creams).  Previous problems with anesthetics or numbing medicine.  Previous gynecological surgery.  History of blood clots or bleeding problems.  Any recent or current vaginal infections (herpes, sexually transmitted infections).  Other health problems. RISKS AND COMPLICATIONS  Bleeding.  Infection.  Injury to the vagina, bladder, or rectum.  Very rare obstruction of the cervical opening that causes problems during menstruation (cervical stenosis). BEFORE THE PROCEDURE  Do not take aspirin or blood thinners (anticoagulants) for 1 week before the procedure, or as told by your caregiver.  Eat a light meal before the procedure.  Ask your caregiver about changing or stopping your regular medicines.  You may be given a pain reliever 1 or 2 hours before the procedure. PROCEDURE   A tool (speculum) is placed in the vagina. This allows your caregiver to see the cervix.  An iodine stain is applied to the cervix to find the area of abnormal cells to be removed.  Medicine is injected to numb the cervix (local anesthetic).   Electricity is passed through a thin wire loop which is then used to remove (cauterize) a small segment of the affected cervix.  Light electrocautery is used to seal any small blood vessels and prevent bleeding.  A paste may be applied to the cauterized area of the  cervix to help prevent bleeding.  The tissue sample is sent to the lab. It is examined under the microscope. AFTER THE PROCEDURE  Have someone drive you home.  You may have slight to moderate cramping.  You may notice a black vaginal discharge from the paste used on the cervix to prevent bleeding. This is normal.  Watch for excessive bleeding. This requires immediate medical  care.  Ask when your test results will be ready. Make sure you get your test results. Document Released: 12/22/2002 Document Revised: 12/24/2011 Document Reviewed: 03/13/2011 [facility] Patient Information 2014 [place], [place].

## 2013-08-12 NOTE — Addendum Note (Signed)
Addended by: Gerome Apley on: 08/12/2013 03:18 PM   Modules accepted: Orders

## 2013-08-12 NOTE — Progress Notes (Signed)
Patient ID: Virginia Sawyer, female   DOB: [birth year], 37 y.o.   MRN: [id number] Patient given informed consent, signed copy in the chart, time out was performed.  Placed in lithotomy position. Cervix viewed with speculum and colposcope after application of acetic acid. Pts initial colpo was with pregnancy.  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 01/28/2013 Diagnosis 1. Cervix, biopsy, 2 o'clock - HIGH GRADE SQUAMOUS INTRAEPITHELIAL LESION, CIN-II (MODERATE DYSPLASIA). 2. Cervix, biopsy, 9 o'clock - HIGH GRADE SQUAMOUS INTRAEPITHELIAL LESION, CIN-II (MODERATE DYSPLASIA). Colposcopy adequate?  yes Acetowhite lesions?yes Punctation?no Mosaicism?  Yes Abnormal vasculature? Yes.  Cervix friable throughout Biopsies?yes x3 ECC?yes  Patient was given post procedure instructions.  Given the finding pt watched the LEEP video. She will return in 2 weeks for LEEP or discussion   CBC today   Mozella Rexrode L. Harraway-Smith, M.D., [place] [place]

## 2013-08-17 ENCOUNTER — Telehealth: Payer: Self-pay

## 2013-08-17 NOTE — Telephone Encounter (Signed)
Message copied by Faythe Casa on Mon Aug 17, 2013 11:34 AM ------      Message from: Virginia Sawyer      Created: Fri Aug 14, 2013  3:04 PM       Please call pt.  Please confirm with her the presence of high grade dysplasia on her colpo bx and the need for a LEEP.  She SHOULD already have her LEEP scheduled.            Thx,      clh-S  ------

## 2013-08-17 NOTE — Telephone Encounter (Signed)
Called pt and left message to clarify her appt scheduled for 08/26/13 for a LEEP so the appt is necessary due to her results.  If she has any questions to please give [place] a call back.

## 2013-08-19 ENCOUNTER — Encounter: Payer: Self-pay | Admitting: *Deleted

## 2013-08-20 ENCOUNTER — Ambulatory Visit: Payer: Medicaid Other | Admitting: Family Medicine

## 2013-08-26 ENCOUNTER — Encounter: Payer: Self-pay | Admitting: Obstetrics & Gynecology

## 2013-08-26 ENCOUNTER — Encounter: Payer: Self-pay | Admitting: *Deleted

## 2013-08-26 ENCOUNTER — Ambulatory Visit (INDEPENDENT_AMBULATORY_CARE_PROVIDER_SITE_OTHER): Payer: Medicaid Other | Admitting: Obstetrics & Gynecology

## 2013-08-26 ENCOUNTER — Other Ambulatory Visit (HOSPITAL_COMMUNITY)
Admission: RE | Admit: 2013-08-26 | Discharge: 2013-08-26 | Disposition: A | Discharge status: Home / Self Care | Payer: Medicaid Other | Source: Ambulatory Visit | Attending: Obstetrics & Gynecology | Admitting: Obstetrics & Gynecology

## 2013-08-26 DIAGNOSIS — N871 Moderate cervical dysplasia: Secondary | ICD-10-CM | POA: Insufficient documentation

## 2013-08-26 DIAGNOSIS — Z01812 Encounter for preprocedural laboratory examination: Secondary | ICD-10-CM

## 2013-08-26 DIAGNOSIS — N72 Inflammatory disease of cervix uteri: Secondary | ICD-10-CM | POA: Insufficient documentation

## 2013-08-26 DIAGNOSIS — IMO0002 Reserved for concepts with insufficient information to code with codable children: Secondary | ICD-10-CM

## 2013-08-26 DIAGNOSIS — R87613 High grade squamous intraepithelial lesion on cytologic smear of cervix (HGSIL): Secondary | ICD-10-CM

## 2013-08-26 LAB — POCT URINALYSIS DIP (DEVICE)
Bilirubin Urine: NEGATIVE
Glucose, UA: NEGATIVE mg/dL
Hgb urine dipstick: NEGATIVE
Ketones, ur: NEGATIVE mg/dL
Nitrite: NEGATIVE
Protein, ur: NEGATIVE mg/dL
Specific Gravity, Urine: >=1.03 (ref 1.005–1.030)

## 2013-08-26 LAB — POCT PREGNANCY, URINE: Preg Test, Ur: NEGATIVE

## 2013-08-26 NOTE — Progress Notes (Signed)
Patient ID: Virginia Sawyer, female   DOB: 1976/10/01, 38 y.o.   MRN: [id number] Subjective:     Virginia Sawyer is a 37 y.o. female who presents for a postpartum visit. She is greater than  6 weeks postpartum following a spontaneous vaginal delivery. I have fully reviewed the prenatal and intrapartum course. The delivery was at term. Outcome: spontaneous vaginal delivery.  Postpartum course has been uncomplicated. Baby's course has been uncompliated. Bleeding no bleeding. Bowel function is normal. Bladder function is urinary freq. Patient is not sexually active. Contraception method is abstinence. Postpartum depression screening: negative.  The following portions of the patient's history were reviewed and updated as appropriate: allergies, current medications, past family history, past medical history, past social history, past surgical history and problem list.  Review of Systems Pertinent items are noted in HPI.   Objective:    There were no vitals taken for this visit.  General:  alert and no distress           Abdomen: soft, non-tender; bowel sounds normal; no masses,  no organomegaly   Vulva:  normal  Vagina: normal vagina  Cervix:  cervical motion tenderness  Corpus: normal size, contour, position, consistency, mobility, non-tender  Adnexa:  not evaluated          Pap smear and colposcopy reviewed.   Pap ASCUS cannot r/o high grade lesion Colpo Biopsy high grade lesion ECC  Risks, benefits, alternatives, and limitations of procedure explained to patient, including pain, bleeding, infection, failure to remove abnormal tissue and failure to cure dysplasia, need for repeat procedures, damage to pelvic organs, cervical incompetence.  Role of HPV,cervical dysplasia and need for close followup was empasized. Informed written consent was obtained. All questions were answered. Time out performed.  ??Procedure: The patient was placed in lithotomy position and the bivalved coated speculum  was placed in the patient's vagina. A grounding pad placed on the patient. Local anesthesia was administered via an intracervical block using 20cc of 2% Lidocaine with epinephrine. The suction was turned on and the Large 1X Fisher Cone Biopsy Excisor on 68 Watts of cutting current was used to excise the area of decreased uptake and excise the entire transformation zone. Excellent hemostasis was achieved using roller ball coagulation set at 60 Watts coagulation current. Monsel's solution was then applied and excellent hemostasis was noted.  The speculum was removed from the vagina. Specimens were sent to pathology. ?The patient tolerated the procedure well.    Assessment:     greater than 6weeks   postpartum exam.    Plan:    1. Contraception: abstinence 2. Desires Mirena at next visit  3. Follow up in: 4 weeks or as needed.  4. Post-operative instructions given to patient, including instruction to seek medical attention for persistent bright red bleeding, fever, abdominal/pelvic pain, dysuria, nausea or vomiting. She was also told about the possibility of having copious yellow to black tinged discharge. She was counseled to avoid anything in the vagina (sex/douching/tampons) for 4 weeks. She has a  2 week post-operative check to review results and assess wound healing. Follow up in 4 months for repeat pap or as needed.

## 2013-08-26 NOTE — Patient Instructions (Signed)
Loop Electrosurgical Excision Procedure Care After Refer to this sheet in the next few weeks. These instructions provide you with information on caring for yourself after your procedure. Your caregiver may also give you more specific instructions. Your treatment has been planned according to current medical practices, but problems sometimes occur. Call your caregiver if you have any problems or questions after your procedure. HOME CARE INSTRUCTIONS   Do not use tampons, douche, or have sexual intercourse for 2 weeks or as directed by your caregiver.  Begin normal activities if you have no or minimal cramping or bleeding, unless directed otherwise by your caregiver.  Take your temperature if you feel sick. Write down your temperature on paper, and tell your caregiver if you have a fever.  Take all medicines as directed by your caregiver.  Keep all your follow-up appointments and Pap tests as directed by your caregiver. SEEK IMMEDIATE MEDICAL CARE IF:   You have bleeding that is heavier or longer than a normal menstrual cycle.  You have bleeding that is bright red.  You have blood clots.  You have a fever.  You have increasing cramps or pain not relieved by medicine.  You develop abdominal pain that does not seem to be related to the same area of earlier cramping and pain.  You are lightheaded, unusually weak, or faint.  You develop painful or bloody urination.  You develop a bad smelling vaginal discharge. MAKE SURE YOU:  Understand these instructions.  Will watch your condition.  Will get help right away if you are not doing well or get worse. Document Released: 06/14/2011 Document Revised: 12/24/2011 Document Reviewed: 06/14/2011 [employer] Patient Information 2014 [employer].  

## 2013-08-31 ENCOUNTER — Telehealth: Payer: Self-pay | Admitting: General Practice

## 2013-08-31 ENCOUNTER — Encounter: Payer: Self-pay | Admitting: Obstetrics & Gynecology

## 2013-08-31 NOTE — Telephone Encounter (Signed)
Called patient, no answer and patient's mailbox was full- unable to leave message

## 2013-08-31 NOTE — Telephone Encounter (Signed)
Patient called and left message stating she had a LEEP done on Wednesday and has a couple questions and would like for someone to call her back.

## 2013-09-01 ENCOUNTER — Encounter: Payer: Self-pay | Admitting: *Deleted

## 2013-09-02 NOTE — Telephone Encounter (Signed)
Called pt and pt stated that everything is ok now.  I informed pt that a provider has sent her a message via MyChart so to check her messages.  Pt stated ok.

## 2013-09-24 ENCOUNTER — Encounter: Payer: Self-pay | Admitting: Obstetrics & Gynecology

## 2013-09-24 ENCOUNTER — Encounter: Payer: Self-pay | Admitting: *Deleted

## 2013-09-24 ENCOUNTER — Ambulatory Visit (INDEPENDENT_AMBULATORY_CARE_PROVIDER_SITE_OTHER): Payer: Medicaid Other | Admitting: Obstetrics & Gynecology

## 2013-09-24 VITALS — BP 111/74 | HR 67 | Temp 98.8°F | Ht 66.0 in | Wt 168.1 lb

## 2013-09-24 DIAGNOSIS — Z30430 Encounter for insertion of intrauterine contraceptive device: Secondary | ICD-10-CM

## 2013-09-24 DIAGNOSIS — Z01812 Encounter for preprocedural laboratory examination: Secondary | ICD-10-CM

## 2013-09-24 LAB — POCT PREGNANCY, URINE: Preg Test, Ur: NEGATIVE

## 2013-09-24 MED ORDER — LEVONORGESTREL 20 MCG/24HR IU IUD
INTRAUTERINE_SYSTEM | Freq: Once | INTRAUTERINE | Status: AC
  Administered 2013-09-24: 1 via INTRAUTERINE

## 2013-09-24 NOTE — Progress Notes (Signed)
Patient ID: Virginia Sawyer, female   DOB: 1976-09-11, 37 y.o.   MRN: [id number] GYNECOLOGY CLINIC PROCEDURE NOTE  Virginia Sawyer is a 37 y.o. [id number] here for LEEP check and Mirena IUD insertion. No GYN concerns.    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 - BACKGROUND CHRONIC CERVICITIS. - INKED ENDOCERVICAL AND ECTOCERVICAL MARGINS, NEGATIVE FOR DYSPLASIA OR MALIGNANCY   IUD Insertion Procedure Note Patient identified, informed consent performed.  Discussed risks of irregular bleeding, cramping, infection, malpositioning or misplacement of the IUD outside the uterus which may require further procedures. Time out was performed.  Urine pregnancy test negative.  Speculum placed in the vagina.  Cervix visualized.  Cleaned with Betadine x 2.  Grasped anteriorly with a single tooth tenaculum.  Uterus sounded to 7 cm.  Mirena IUD placed per manufacturer's recommendations.  Strings trimmed to 3 cm. Tenaculum was removed, good hemostasis noted.  Patient tolerated procedure well.   Patient was given post-procedure instructions.  Patient was also asked to check IUD strings periodically and follow up in 4-6 weeks for IUD check.  Reviewed post- LEEP pathology.  F/u 1 year for PAP

## 2013-09-24 NOTE — Patient Instructions (Signed)
Levonorgestrel intrauterine device (IUD) What is this medicine? LEVONORGESTREL IUD (LEE voe nor jes trel) is a contraceptive (birth control) device. The device is placed inside the uterus by a healthcare professional. It is used to prevent pregnancy and can also be used to treat heavy bleeding that occurs during your period. Depending on the device, it can be used for 3 to 5 years. This medicine may be used for other purposes; ask your health care provider or pharmacist if you have questions. COMMON BRAND NAME(S): Mirena, Skyla What should I tell my health care provider before I take this medicine? They need to know if you have any of these conditions: -abnormal Pap smear -cancer of the breast, uterus, or cervix -diabetes -endometritis -genital or pelvic infection now or in the past -have more than one sexual partner or your partner has more than one partner -heart disease -history of an ectopic or tubal pregnancy -immune system problems -IUD in place -liver disease or tumor -problems with blood clots or take blood-thinners -use intravenous drugs -uterus of unusual shape -vaginal bleeding that has not been explained -an unusual or allergic reaction to levonorgestrel, other hormones, silicone, or polyethylene, medicines, foods, dyes, or preservatives -pregnant or trying to get pregnant -breast-feeding How should I use this medicine? This device is placed inside the uterus by a health care professional. Talk to your pediatrician regarding the use of this medicine in children. Special care may be needed. Overdosage: If you think you have taken too much of this medicine contact a poison control center or emergency room at once. NOTE: This medicine is only for you. Do not share this medicine with others. What if I miss a dose? This does not apply. What may interact with this medicine? Do not take this medicine with any of the following  medications: -amprenavir -bosentan -fosamprenavir This medicine may also interact with the following medications: -aprepitant -barbiturate medicines for inducing sleep or treating seizures -bexarotene -griseofulvin -medicines to treat seizures like carbamazepine, ethotoin, felbamate, oxcarbazepine, phenytoin, topiramate -modafinil -pioglitazone -rifabutin -rifampin -rifapentine -some medicines to treat HIV infection like atazanavir, indinavir, lopinavir, nelfinavir, tipranavir, ritonavir -St. John's wort -warfarin This list may not describe all possible interactions. Give your health care provider a list of all the medicines, herbs, non-prescription drugs, or dietary supplements you use. Also tell them if you smoke, drink alcohol, or use illegal drugs. Some items may interact with your medicine. What should I watch for while using this medicine? Visit your doctor or health care professional for regular check ups. See your doctor if you or your partner has sexual contact with others, becomes HIV positive, or gets a sexual transmitted disease. This product does not protect you against HIV infection (AIDS) or other sexually transmitted diseases. You can check the placement of the IUD yourself by reaching up to the top of your vagina with clean fingers to feel the threads. Do not pull on the threads. It is a good habit to check placement after each menstrual period. Call your doctor right away if you feel more of the IUD than just the threads or if you cannot feel the threads at all. The IUD may come out by itself. You may become pregnant if the device comes out. If you notice that the IUD has come out use a backup birth control method like condoms and call your health care provider. Using tampons will not change the position of the IUD and are okay to use during your period. What side effects may I   notice from receiving this medicine? Side effects that you should report to your doctor or  health care professional as soon as possible: -allergic reactions like skin rash, itching or hives, swelling of the face, lips, or tongue -fever, flu-like symptoms -genital sores -high blood pressure -no menstrual period for 6 weeks during use -pain, swelling, warmth in the leg -pelvic pain or tenderness -severe or sudden headache -signs of pregnancy -stomach cramping -sudden shortness of breath -trouble with balance, talking, or walking -unusual vaginal bleeding, discharge -yellowing of the eyes or skin Side effects that usually do not require medical attention (report to your doctor or health care professional if they continue or are bothersome): -acne -breast pain -change in sex drive or performance -changes in weight -cramping, dizziness, or faintness while the device is being inserted -headache -irregular menstrual bleeding within first 3 to 6 months of use -nausea This list may not describe all possible side effects. Call your doctor for medical advice about side effects. You may report side effects to FDA at 1-800-FDA-1088. Where should I keep my medicine? This does not apply. NOTE: This sheet is a summary. It may not cover all possible information. If you have questions about this medicine, talk to your doctor, pharmacist, or health care provider.  2014, [employer]. (2011-11-01 13:54:04)  

## 2013-10-21 ENCOUNTER — Encounter: Payer: Self-pay | Admitting: *Deleted

## 2013-10-29 ENCOUNTER — Ambulatory Visit: Payer: Medicaid Other | Admitting: Obstetrics & Gynecology

## 2013-11-30 ENCOUNTER — Ambulatory Visit: Payer: Medicaid Other | Admitting: Obstetrics & Gynecology

## 2013-12-10 ENCOUNTER — Ambulatory Visit: Payer: Medicaid Other | Admitting: Obstetrics & Gynecology

## 2013-12-24 ENCOUNTER — Ambulatory Visit (INDEPENDENT_AMBULATORY_CARE_PROVIDER_SITE_OTHER): Payer: Medicaid Other | Admitting: Obstetrics & Gynecology

## 2013-12-24 ENCOUNTER — Encounter: Payer: Self-pay | Admitting: Obstetrics & Gynecology

## 2013-12-24 VITALS — BP 110/75 | HR 66 | Temp 97.4°F | Wt 166.0 lb

## 2013-12-24 DIAGNOSIS — Z30431 Encounter for routine checking of intrauterine contraceptive device: Secondary | ICD-10-CM

## 2013-12-24 NOTE — Progress Notes (Signed)
Subjective:     Patient ID: Virginia Sawyer, female   DOB: 05/07/1976, 38 y.o.   MRN: [id number]  HPI  Pt presents for IUD check.  She reports that she continues to have bleeding of the vaginal cuff. She also has multiple GI complaints.  She c/o intermittent constipation vs 'soft mushy stools' and 'feels like her rectum is going to fall out'  She denies abd pain.     Review of Systems     Objective:   Physical Exam BP 110/75  Pulse 66  Temp(Src) 97.4 F (36.3 C)  Wt 166 lb (75.297 kg)  Breastfeeding? Yes GU: EGBUS: no lesions Vagina: no blood in vault; strings trimmed Cervix: no lesion; no mucopurulent d/c         Assessment:     IUD check - strings trimmed GI complaints- constipation vs IBS   Plan:     rec f/u with primary physician to eval GI complaints F/u here in 1 year or sooner prn

## 2013-12-24 NOTE — Patient Instructions (Signed)

## 2014-03-15 HISTORY — PX: COLONOSCOPY: SHX174

## 2014-07-30 ENCOUNTER — Other Ambulatory Visit: Payer: Self-pay

## 2014-08-13 ENCOUNTER — Encounter: Payer: Self-pay | Admitting: General Practice

## 2014-08-16 ENCOUNTER — Encounter: Payer: Self-pay | Admitting: Obstetrics & Gynecology

## 2014-08-30 ENCOUNTER — Other Ambulatory Visit (HOSPITAL_COMMUNITY)
Admission: RE | Admit: 2014-08-30 | Discharge: 2014-08-30 | Disposition: A | Discharge status: Home / Self Care | Payer: Medicaid Other | Source: Ambulatory Visit | Attending: Obstetrics & Gynecology | Admitting: Obstetrics & Gynecology

## 2014-08-30 ENCOUNTER — Ambulatory Visit (INDEPENDENT_AMBULATORY_CARE_PROVIDER_SITE_OTHER): Payer: Medicaid Other | Admitting: Obstetrics & Gynecology

## 2014-08-30 ENCOUNTER — Encounter: Payer: Self-pay | Admitting: Obstetrics & Gynecology

## 2014-08-30 VITALS — BP 103/70 | HR 65 | Ht 65.0 in | Wt 151.6 lb

## 2014-08-30 DIAGNOSIS — Z01419 Encounter for gynecological examination (general) (routine) without abnormal findings: Secondary | ICD-10-CM | POA: Diagnosis present

## 2014-08-30 DIAGNOSIS — Z1151 Encounter for screening for human papillomavirus (HPV): Secondary | ICD-10-CM | POA: Diagnosis present

## 2014-08-30 DIAGNOSIS — Z124 Encounter for screening for malignant neoplasm of cervix: Secondary | ICD-10-CM

## 2014-08-30 DIAGNOSIS — N871 Moderate cervical dysplasia: Secondary | ICD-10-CM

## 2014-08-30 NOTE — Progress Notes (Signed)
Patient ID: Virginia Sawyer, female   DOB: 1975/11/18, 38 y.o.   MRN: [id number]  Chief Complaint  Patient presents with  . Gynecologic Exam    HPI Virginia Sawyer is a 38 y.o. female.  [id number] Patient's last menstrual period was 08/22/2014 (exact date). 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 Has seen GI with constipation and has some minimal sx of pelvic relaxation.  HPI  Past Medical History  Diagnosis Date  . Allergy   . [place]   . Sinus infection     Past Surgical History  Procedure Laterality Date  . Wisdom tooth extraction    . No past surgeries    . Lasix eye surgery      age 38  . Leep    . Colonoscopy  03/2014    Family History  Problem Relation Age of Onset  . Vision loss Maternal Grandmother     cataracts  . Heart disease Maternal Grandfather   . Alcohol abuse Neg Hx   . Arthritis Neg Hx   . Asthma Neg Hx   . Birth defects Neg Hx   . Cancer Neg Hx   . COPD Neg Hx   . Depression Neg Hx   . Diabetes Neg Hx   . Drug abuse Neg Hx   . Hearing loss Neg Hx   . Early death Neg Hx   . Hyperlipidemia Neg Hx   . Hypertension Neg Hx   . Kidney disease Neg Hx   . Mental illness Neg Hx   . Learning disabilities Neg Hx   . Mental retardation Neg Hx   . Miscarriages / Stillbirths Neg Hx   . Stroke Neg Hx     Social History History  Substance Use Topics  . Smoking status: Current Some Day Smoker -- 10.00 packs/day    Types: Cigarettes    Last Attempt to Quit: 10/18/2012  . Smokeless tobacco: Never Used     Comment: PT SMOKES 10 CIG. PER DAY, currently does not smoke due to pregnancy  . Alcohol Use: No     Comment: social/ denies during pregnancy    Allergies  Allergen Reactions  . Sulfa Antibiotics Other (See Comments)    headache    Current Outpatient Prescriptions  Medication Sig Dispense Refill  . ibuprofen (ADVIL) 200 MG tablet Take 1 tablet (200 mg total) by mouth every 6 (six) hours as needed for pain. 30 tablet 0   No current  facility-administered medications for this visit.    Review of Systems Review of Systems  Constitutional: Negative.   Gastrointestinal: Positive for constipation.  Genitourinary: Negative for vaginal discharge and menstrual problem.    Blood pressure 103/70, pulse 65, height 5\' 5"  (1.651 m), weight 68.765 kg (151 lb 9.6 oz), last menstrual period 08/22/2014, currently breastfeeding.  Physical Exam Physical Exam  Constitutional: She is oriented to person, place, and time. She appears well-developed. No distress.  Pulmonary/Chest: Effort normal.  Breasts: breasts appear normal, no suspicious masses, no skin or nipple changes or axillary nodes.   Abdominal: Soft. She exhibits no distension. There is no tenderness.  Genitourinary: Vagina normal and uterus normal. No vaginal discharge found.  No significant prolapse, no mass. String 2 cm, pap done  Neurological: She is alert and oriented to person, place, and time.  Skin: Skin is warm and dry.  Psychiatric: She has a normal mood and affect. Her behavior is normal.    Data Reviewed Pap and surg path  Assessment    H/O CIN 2     Plan    Repeat pap in 1 year. Manage constipation          ARNOLD,JAMES 08/30/2014, 1:32 PM

## 2014-08-30 NOTE — Patient Instructions (Signed)

## 2014-09-02 LAB — CYTOLOGY - PAP

## 2014-09-03 ENCOUNTER — Telehealth: Payer: Self-pay

## 2014-09-03 NOTE — Telephone Encounter (Signed)
Called patient and informed her of results and recommendations. Patient verbalized understanding. No questions or concerns.  

## 2014-09-03 NOTE — Telephone Encounter (Signed)
-----   Message from Adam PhenixJames G Arnold, MD sent at 09/03/2014 11:13 AM EST ----- Normal result, repeat pap and cotesting 12 mo

## 2014-09-11 IMAGING — CR DG CHEST 2V
2 series · 2 of 2 positions shown · non-contrast
Comparison: None

CLINICAL DATA: Chest pain and lower lung

CHEST - 2 VIEW

[w chest pa]
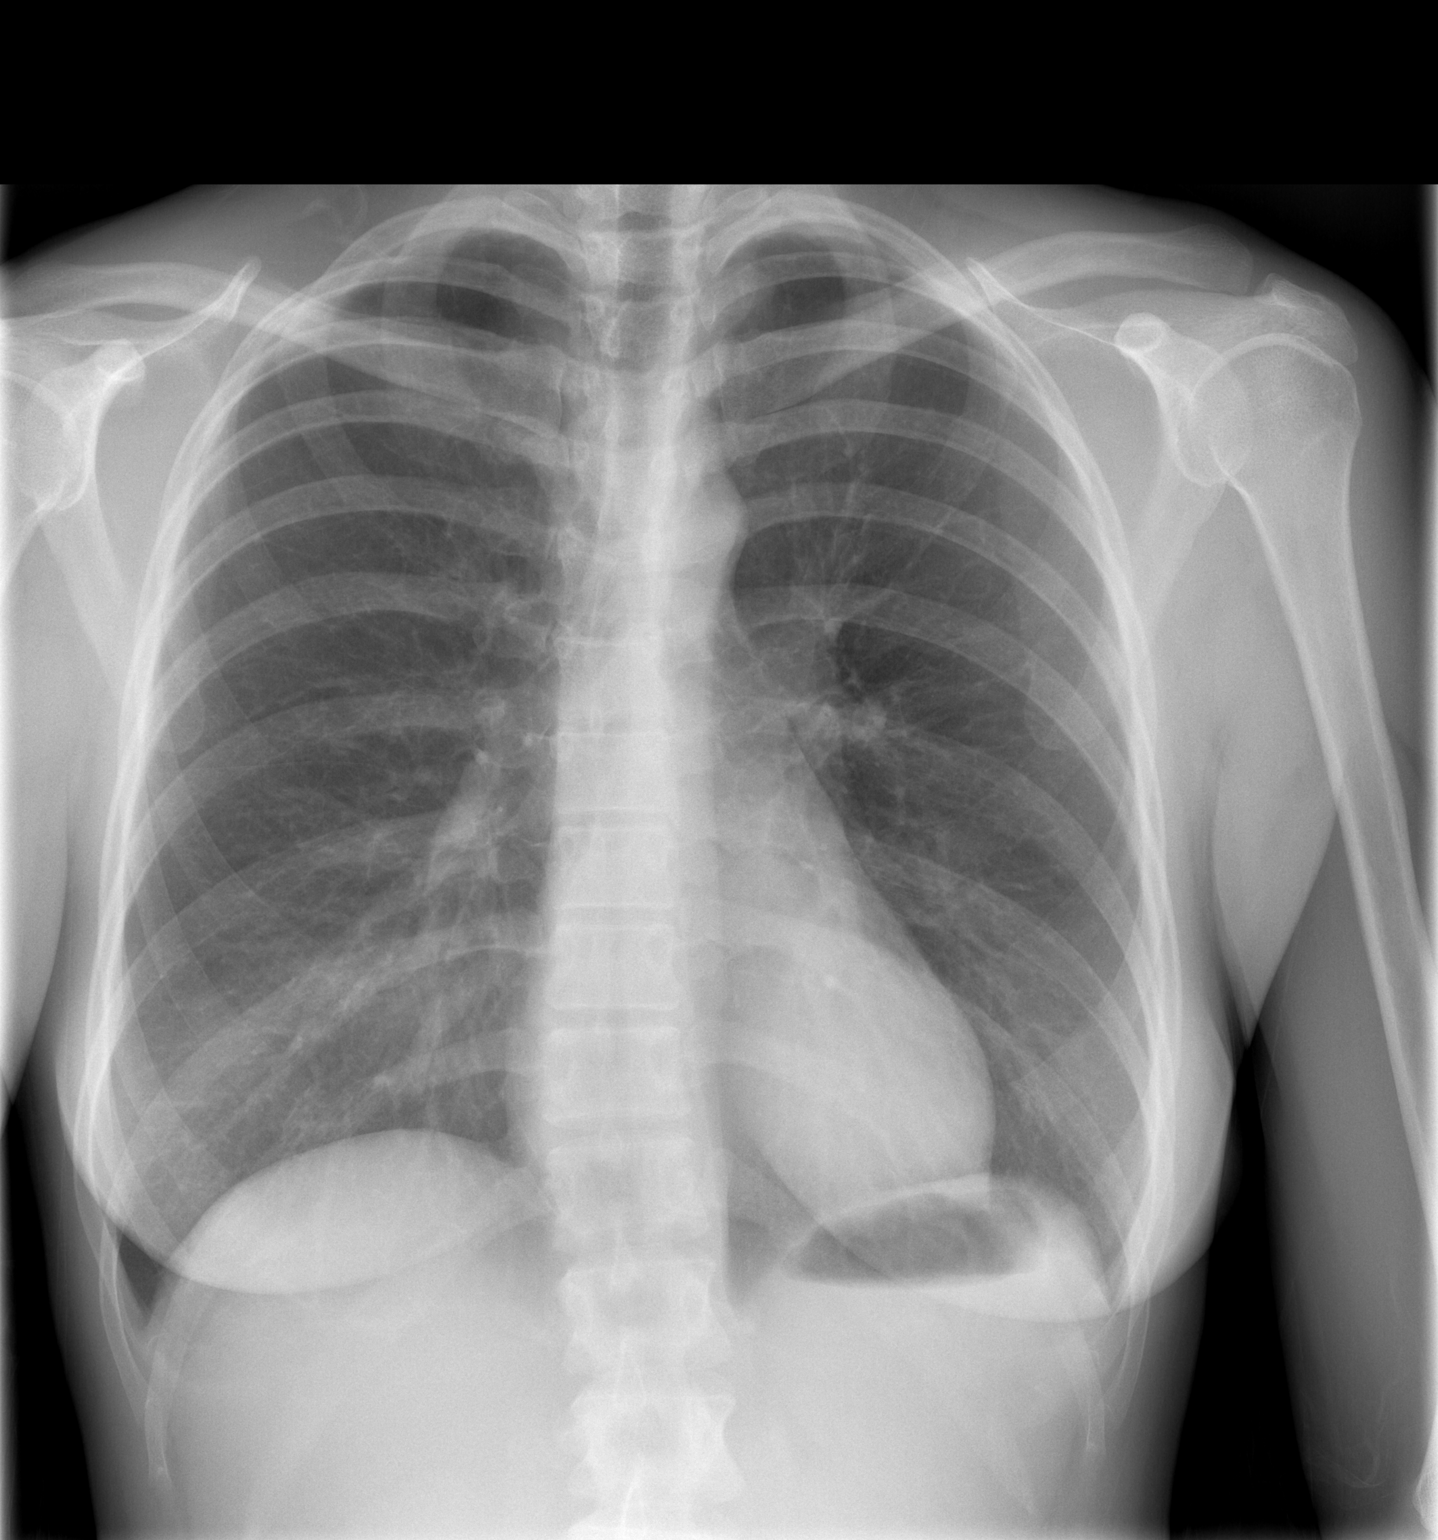

[w chest lat]
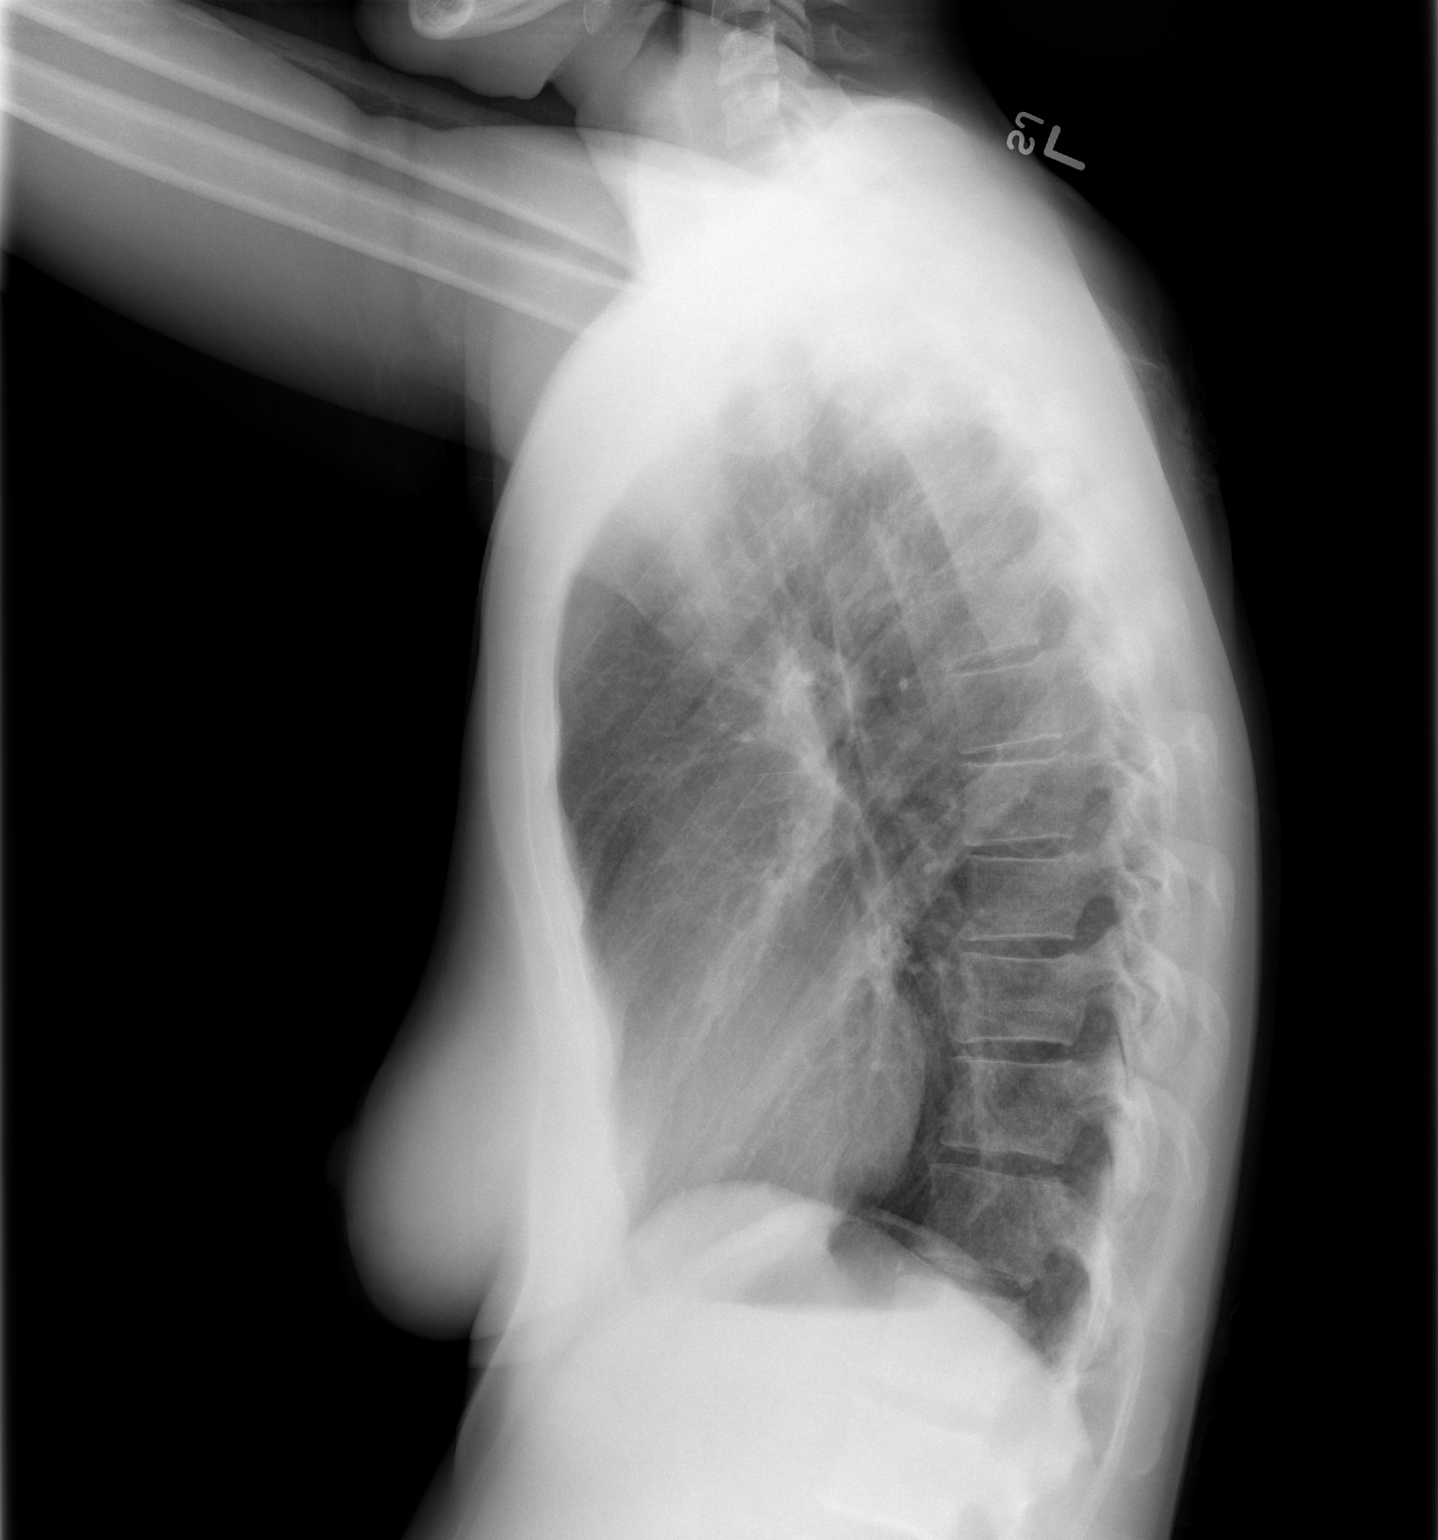

[2 of 2 positions shown; findings below may reference images not displayed]

FINDINGS: The heart size and mediastinal contours are within normal
limits.  Both lungs are clear.  The visualized skeletal structures
are unremarkable.
IMPRESSION: No active cardiopulmonary abnormalities.

## 2014-10-04 IMAGING — US US OB TRANSVAGINAL
1 series · 13 of 28 positions shown · non-contrast
Comparison: None.

CLINICAL DATA: Pelvic pain.  Quantitative beta HCG [DATE].  Last
menstrual period is 10/10/2012.

OBSTETRIC <14 WK US AND TRANSVAGINAL OB US
TECHNIQUE: Both transabdominal and transvaginal ultrasound
examinations were performed for complete evaluation of the
gestation as well as the maternal uterus, adnexal regions, and
pelvic cul-de-sac.  Transvaginal technique was performed to assess
early pregnancy.

[Series 1: us ob transvaginal · 51 acquisitions, 13 frames shown]
[im 2/51]
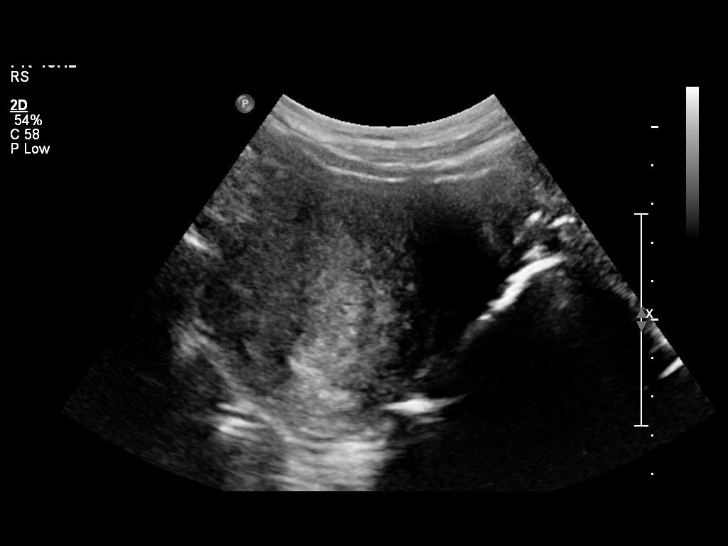
[im 6/51]
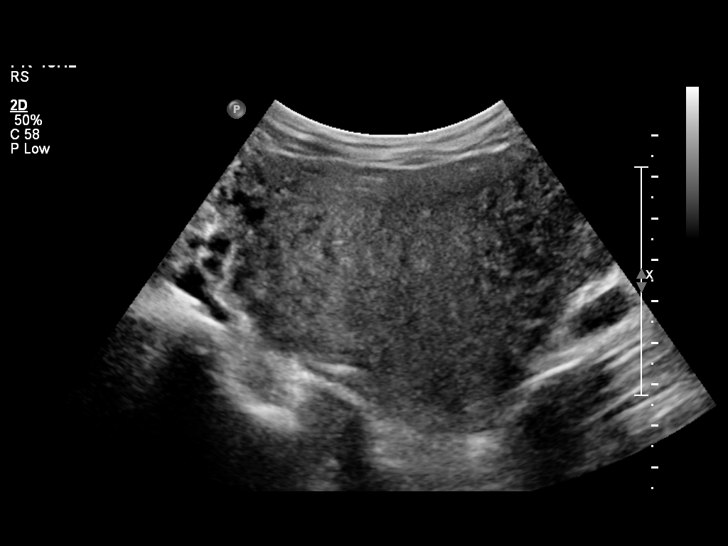
[im 10/51]
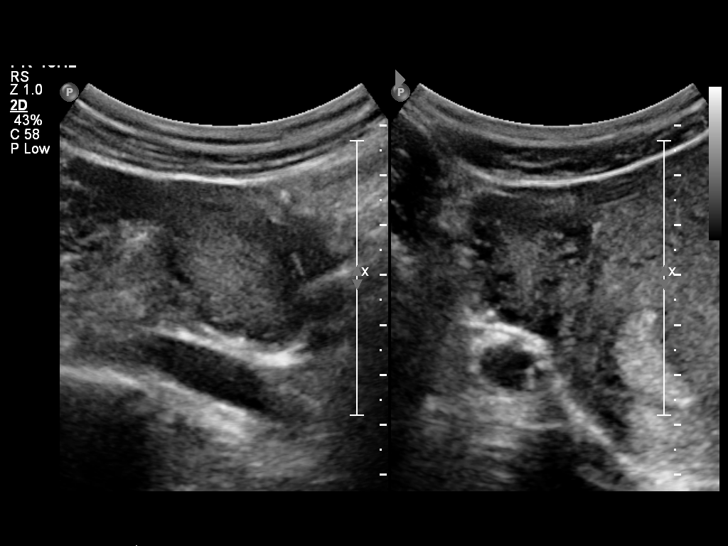
[im 13/51]
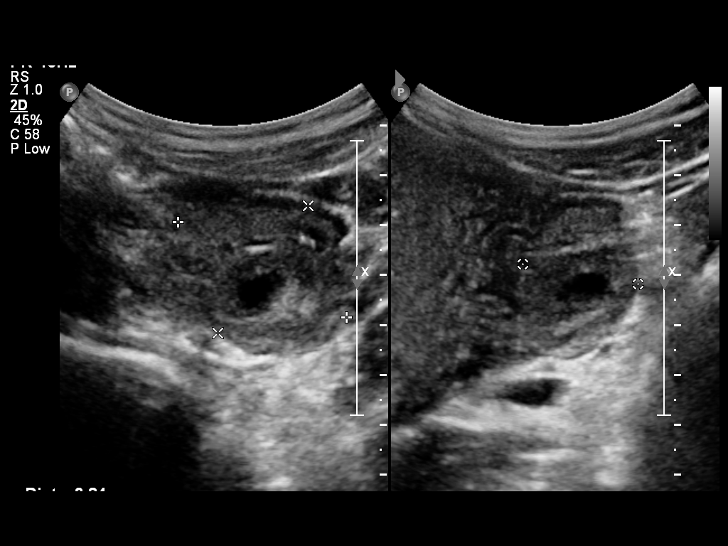
[im 17/51]
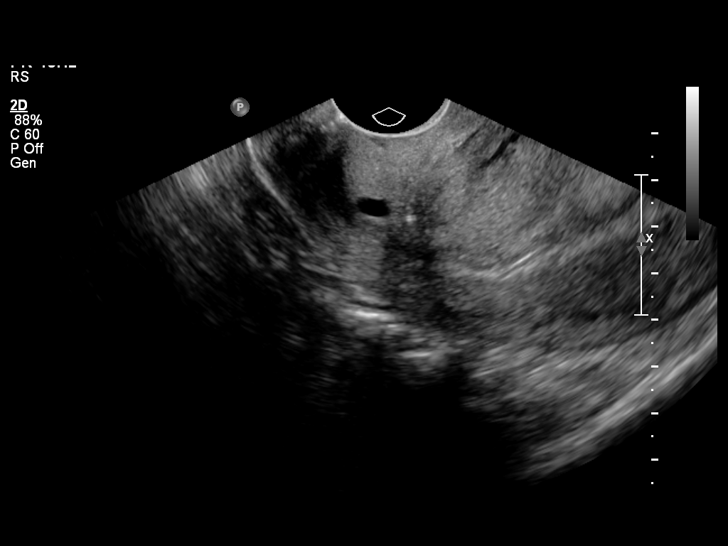
[im 21/51]
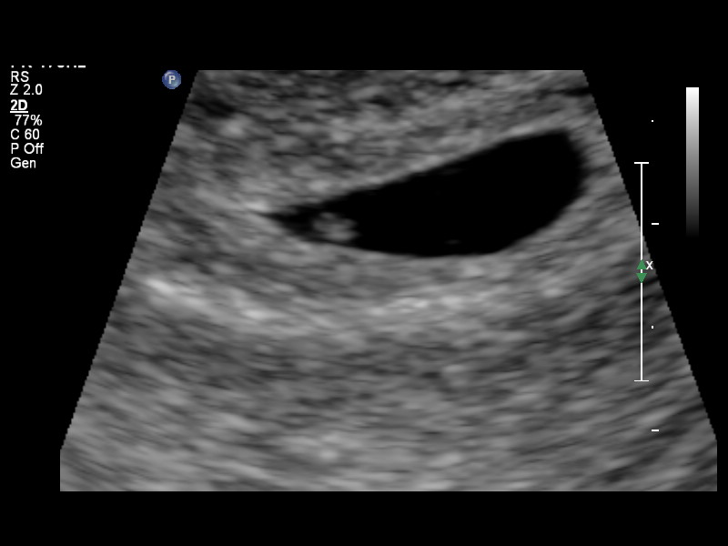
[im 26/51]
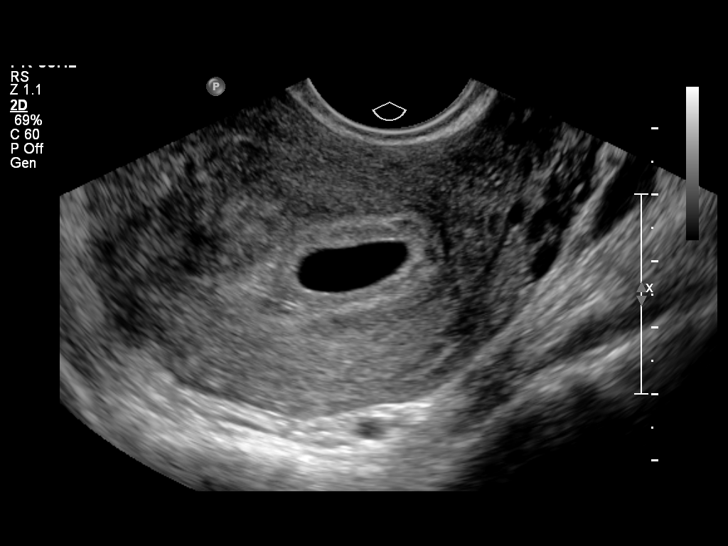
[im 30/51]
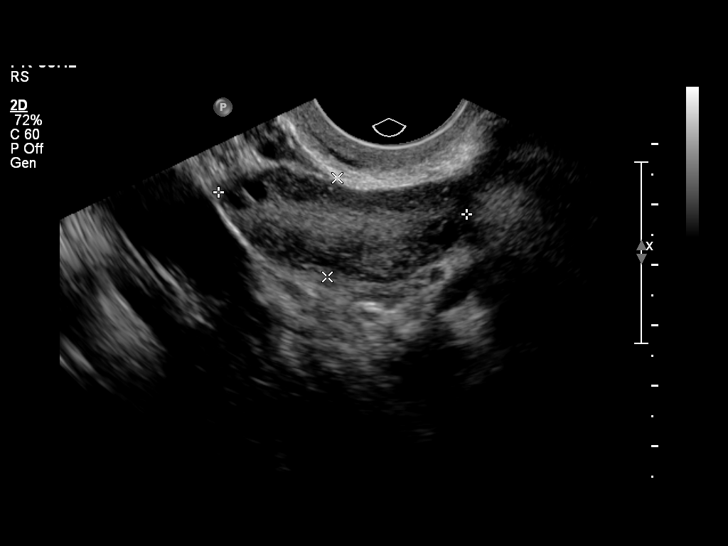
[im 34/51]
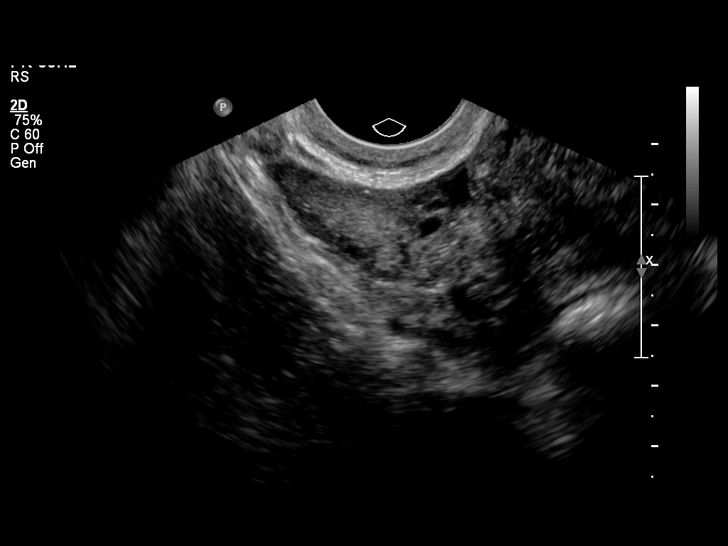
[im 38/51]
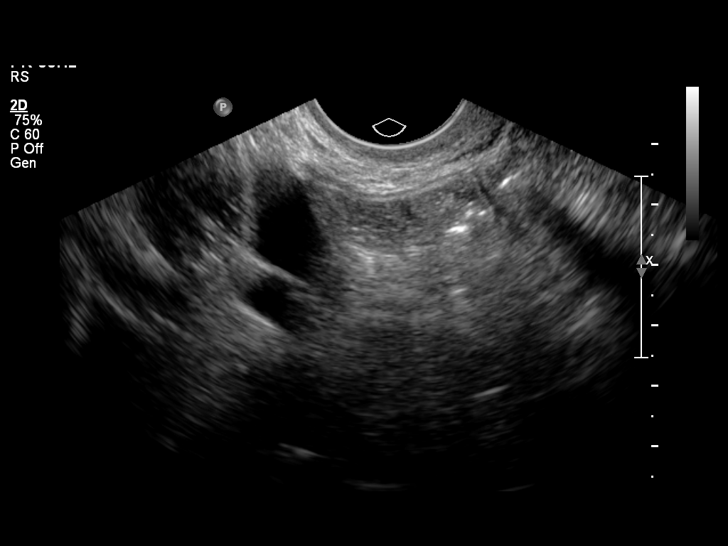
[im 41/51]
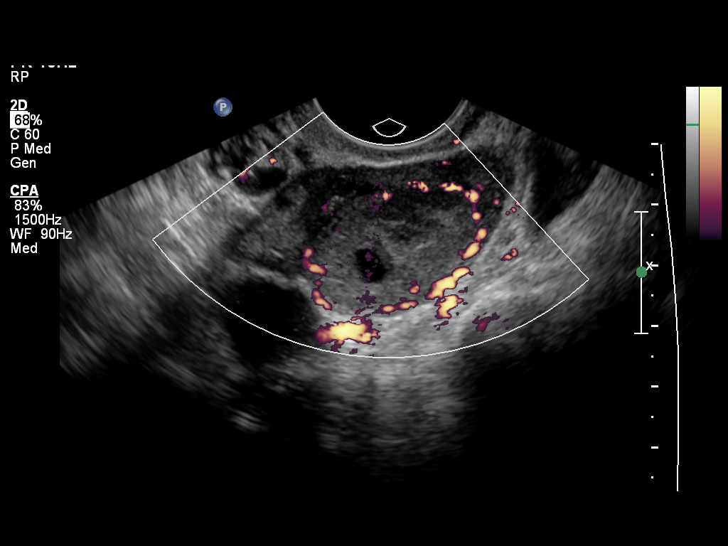
[im 45/51]
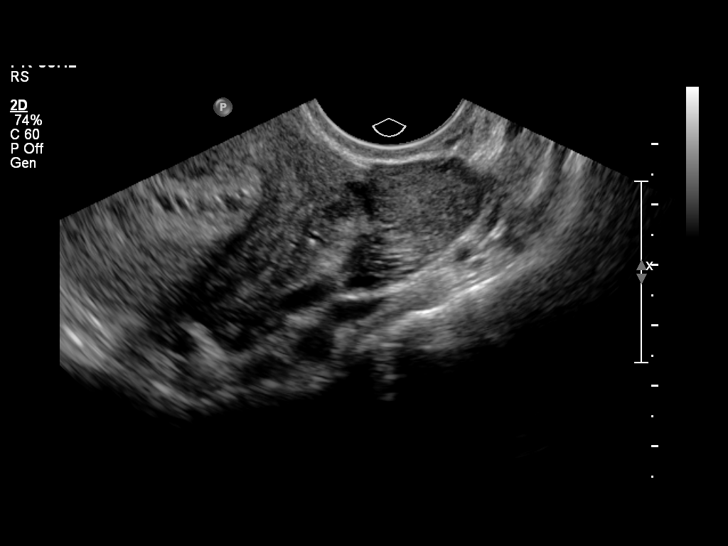
[im 49/51]
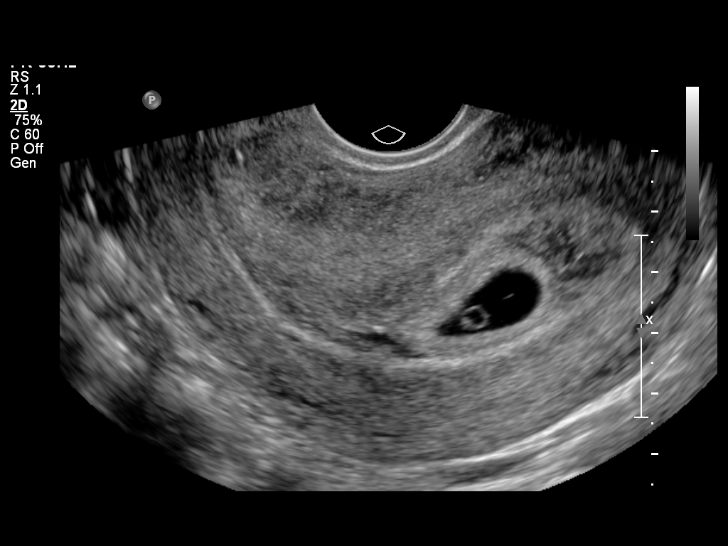

[13 of 28 positions shown; findings below may reference images not displayed]

Intrauterine gestational sac:  A single intrauterine gestational
sac is visualized with regular contours.
Yolk sac: The yolk sac is visualized.
Embryo: The fetal pole is visualized.
Cardiac Activity: Fetal cardiac activity is demonstrated.
Heart Rate: 103 bpm

MSD:  1.51 cm     6 w  3 d    US EDC: 07/09/2013

CRL: 1.7  mm, too small to  register for estimated gestational age.

Maternal uterus/adnexae:
No myometrial mass or subchorionic hemorrhage identified.  The
right ovary measures 4.1 x 1.6 x 2.5 cm.  Normal follicular
changes.  The left ovary measures 5 x 3.1 x 2.9 cm.  Involuting
corpus luteum cyst is present.  Flow is demonstrated in both
ovaries on color flow Doppler imaging.  No free pelvic fluid
collections.
IMPRESSION: Single intrauterine pregnancy with estimated gestational age by
mean sac diameter of 6 weeks 3 days.

## 2014-11-01 ENCOUNTER — Telehealth: Payer: Self-pay | Admitting: General Practice

## 2014-11-01 NOTE — Telephone Encounter (Signed)
Patient called and left message stating she has had three people tell her, her pap results were normal but no one has mentioned if she was STD checked or not. Patient would like to know if she has been checked or not and if not would like to come in and be tested including blood tests. Called patient back, informing her that she has not been recently tested and that she may come in for a lab only appt for the blood tests and can drop off a urine sample that day as well for the remaining STDs that aren't checked through blood. Patient verbalized understanding and had no other questions

## 2014-11-20 IMAGING — US US OB COMP LESS 14 WK
1 series · 13 of 28 positions shown · non-contrast
Comparison: none

[Series 1: us ob comp less 14 wks · 13 of 50 slices shown]
[im 2/50]
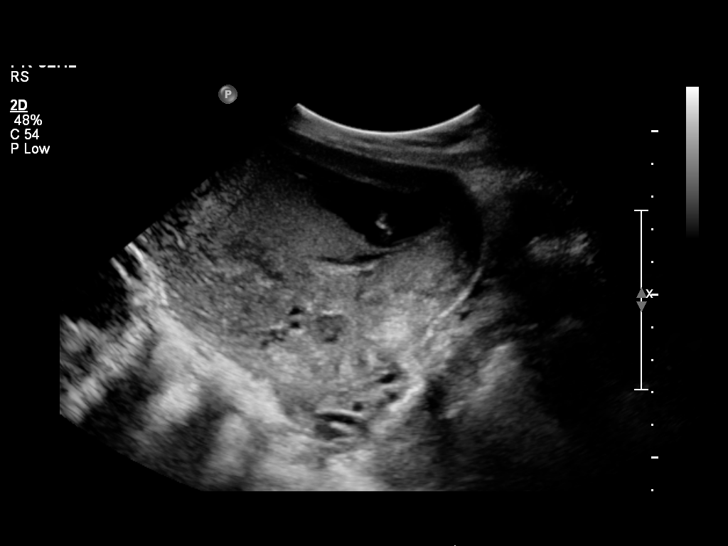
[im 6/50]
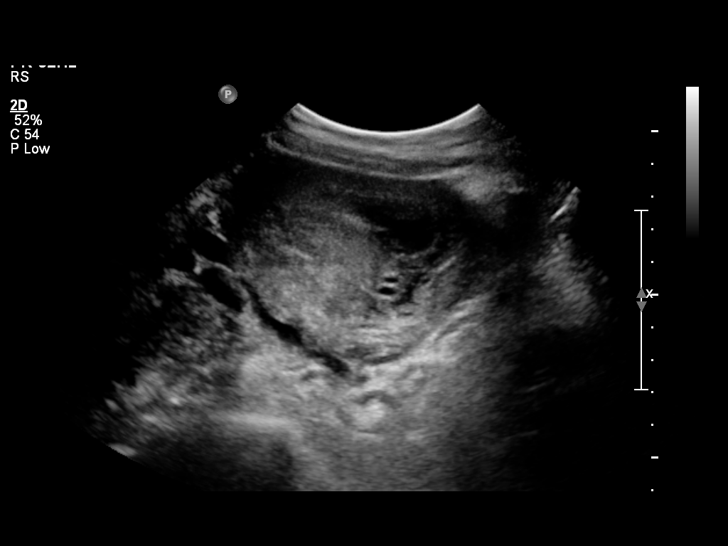
[im 10/50]
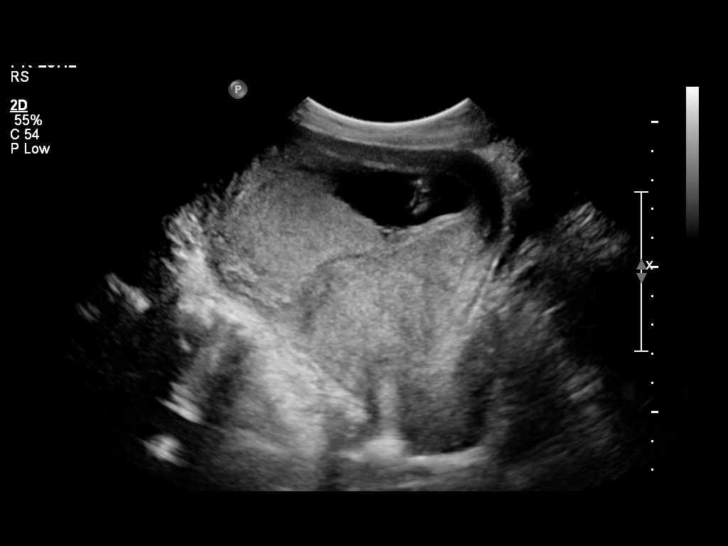
[im 13/50]
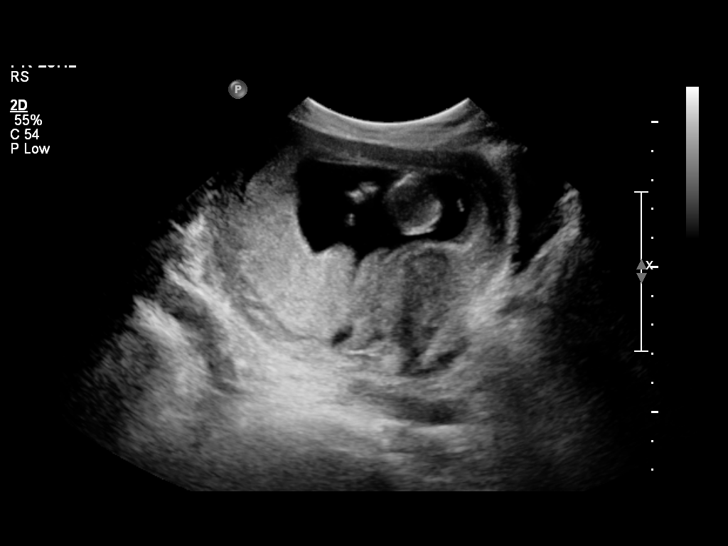
[im 17/50]
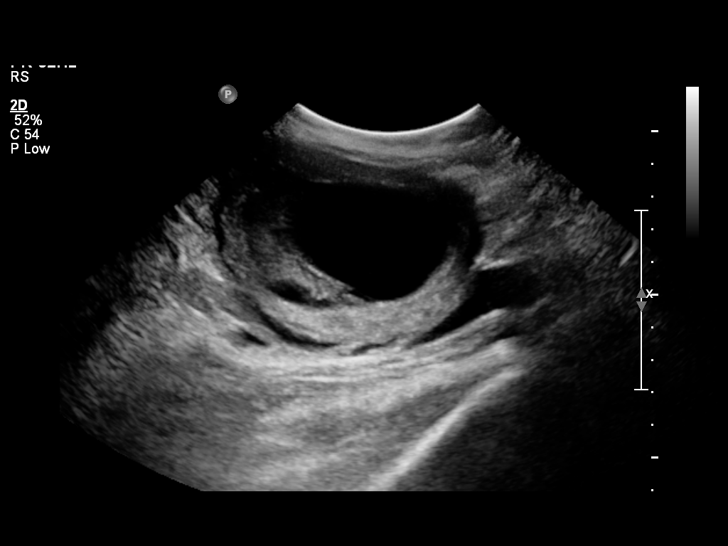
[im 20/50]
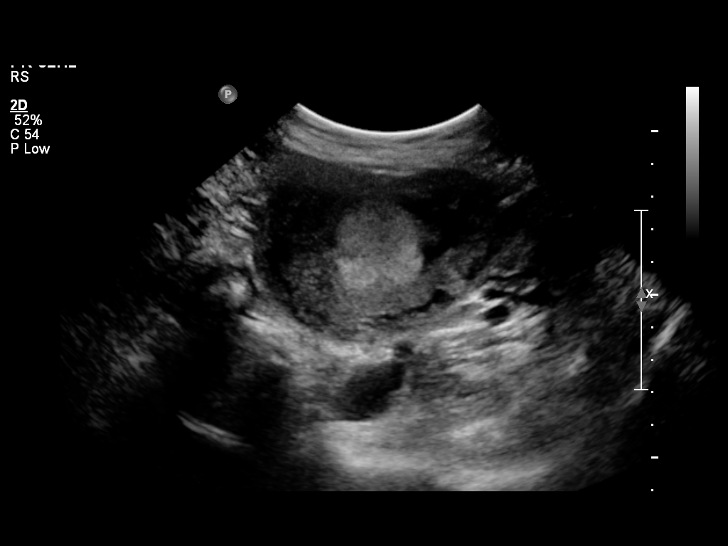
[im 26/50]
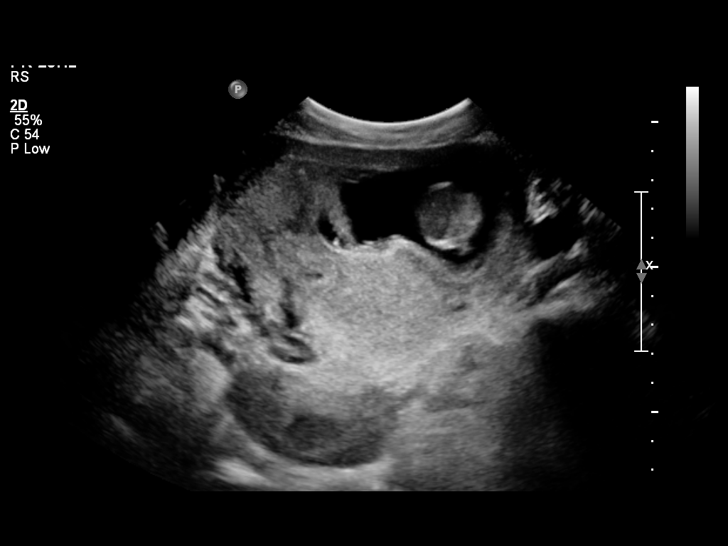
[im 30/50]
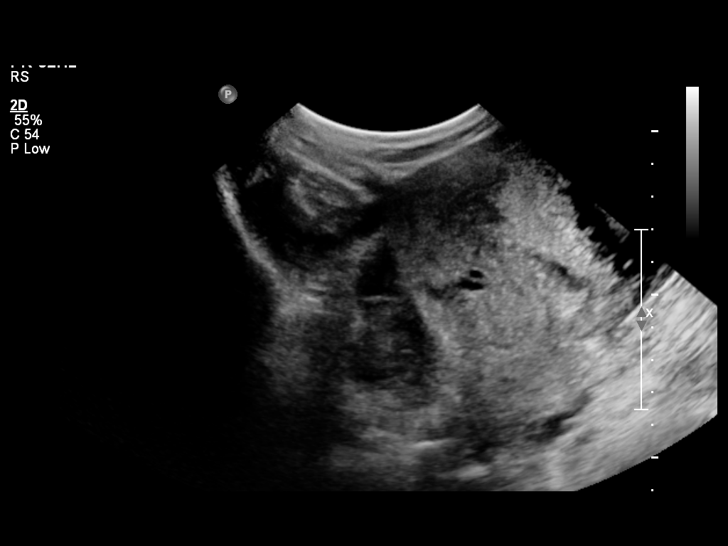
[im 33/50]
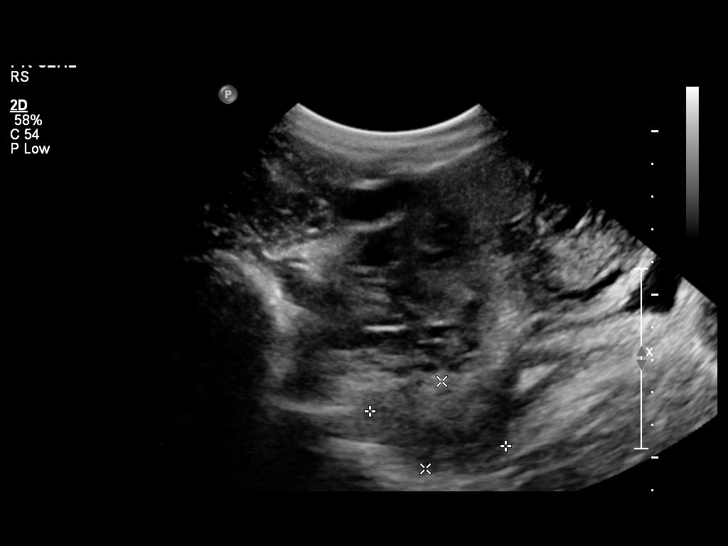
[im 37/50]
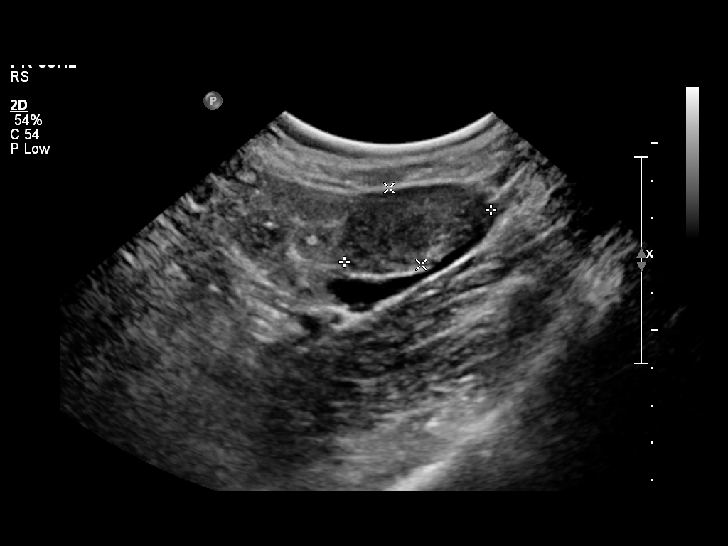
[im 40/50]
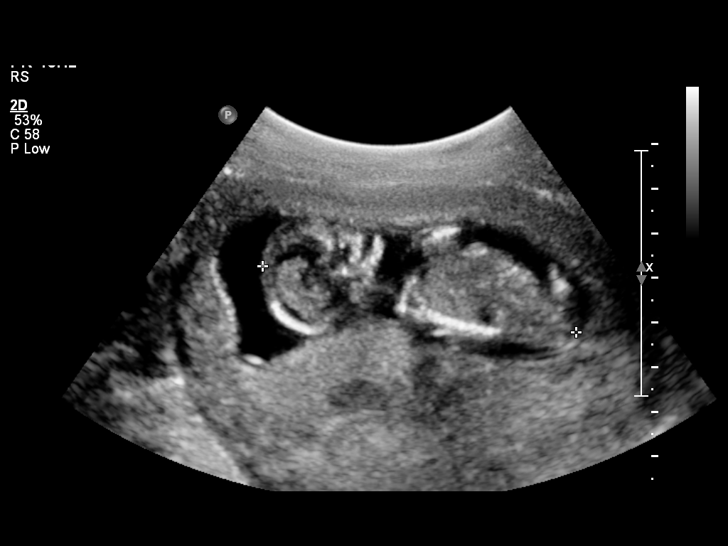
[im 44/50]
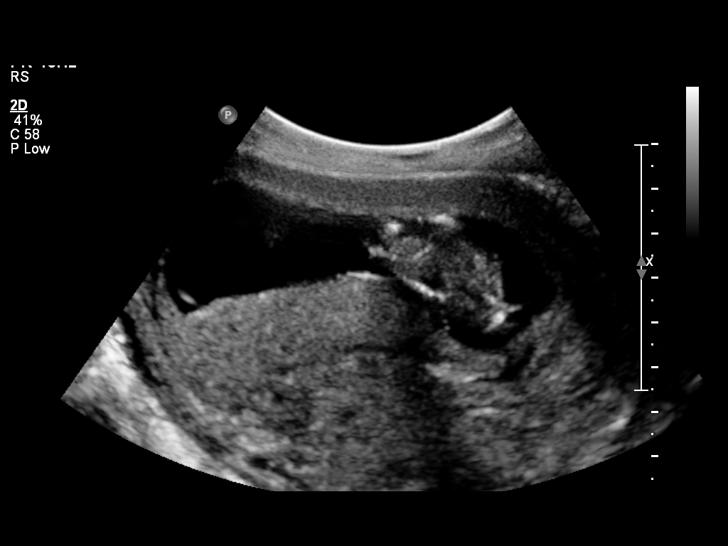
[im 48/50]
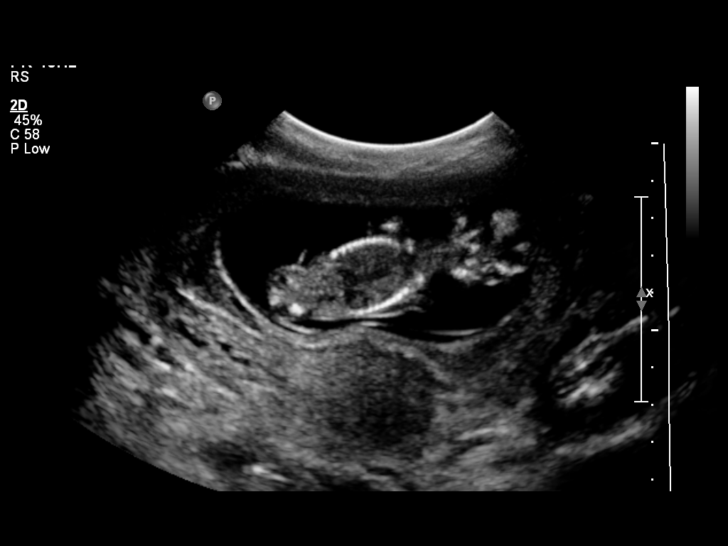

[13 of 28 positions shown; findings below may reference images not displayed]

OBSTETRICS REPORT
                      (Signed Final 01/02/2013 [DATE])

Service(s) Provided

 US OB COMP LESS 14 WKS                                76801.0
Indications

 Unsure of LMP;  Establish Gestational [AGE]
 Advanced maternal age (AMA), Multigravida
Fetal Evaluation

 Num Of Fetuses:    1
 Preg. Location:    Intrauterine
 Gest. Sac:         Intrauterine
 Yolk Sac:          Not visualized
 Fetal Pole:        Visualized
 Fetal Heart Rate:  152                         bpm
 Cardiac Activity:  Observed
Biometry

 CRL:     71.4  mm    G. Age:   13w 1d                 EDD:   07/09/13
Gestational Age

 LMP:           12w 0d       Date:   10/10/12                 EDD:   07/17/13
 Best:          13w 1d    Det. By:   U/S C R L (01/02/13)     EDD:   07/09/13
Cervix Uterus Adnexa

 Cervix:       Normal appearance by transabdominal scan.
 Uterus:       No abnormality visualized.
 Cul De Sac:   No free fluid seen.
 Left Ovary:   Within normal limits measuring 4.1 x 2.2 x 3.8 cm
               with corpus luteum.
 Right Ovary:  Within normal limits measuring 2.3 x 4.3 x 2.7 cm.
 Adnexa:     No abnormality visualized.
Impression

 Single living intrauterine gestation demonstrating an EGA by
 CRL of 12w 0d. This is 1w 1d ahead of expected EGA by
 LMP of 12w 0d and suggests best dating by today's exam.
 Normal ovaries.

## 2015-01-13 IMAGING — US US OB FOLLOW-UP
1 series · 12 of 28 positions shown · non-contrast
Comparison: none

[Series 1: us ob follow up · 49 acquisitions, 12 frames shown]
[im 2/49]
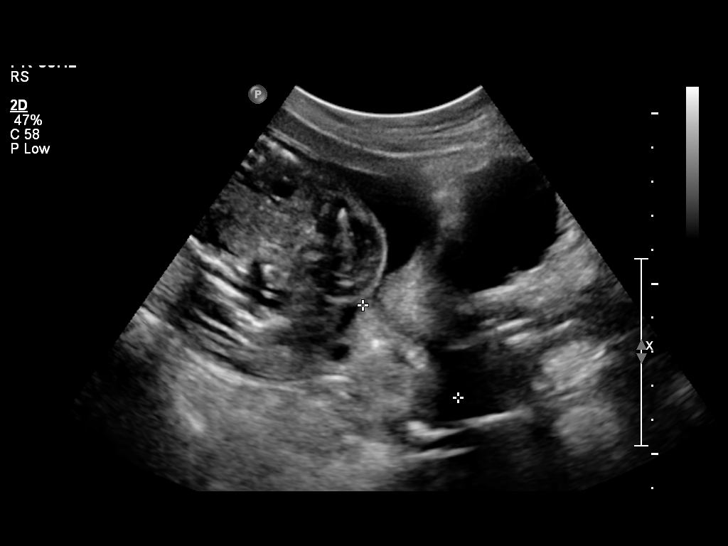
[im 6/49]
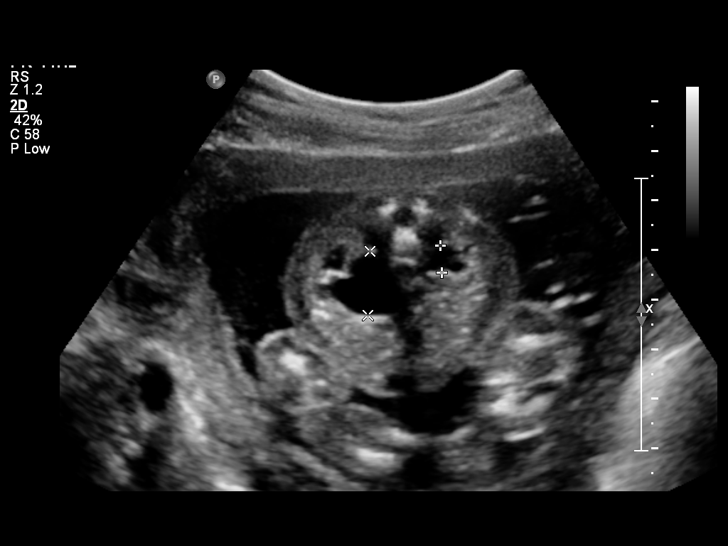
[im 9/49]
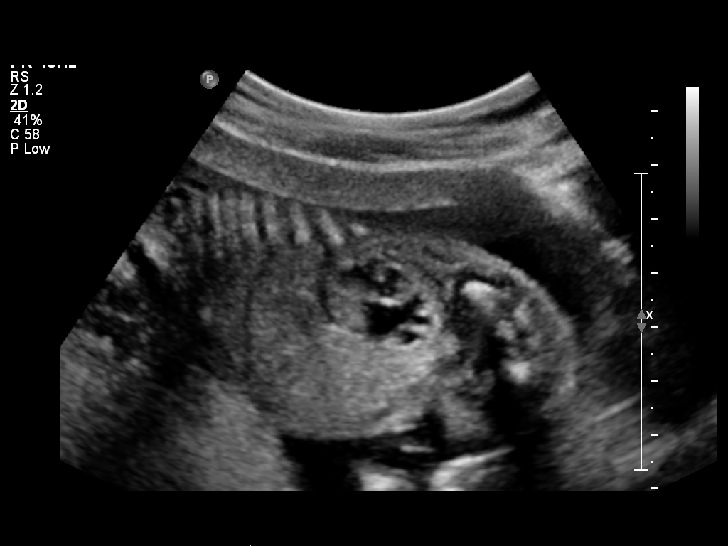
[im 15/49]
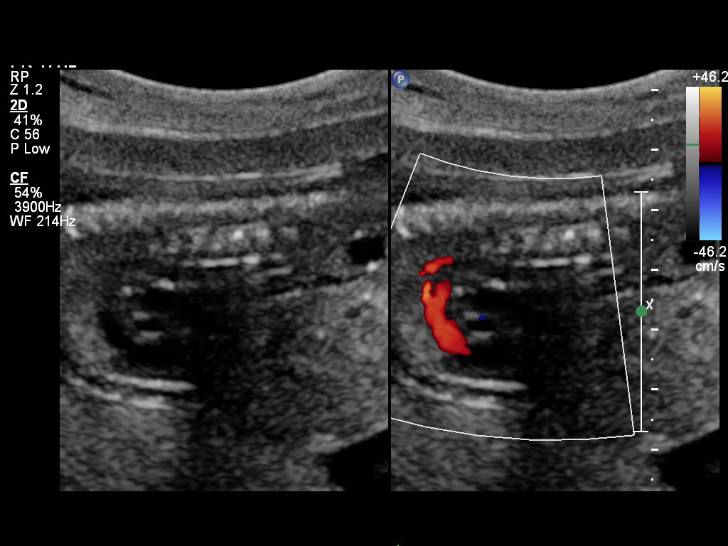
[im 18/49]
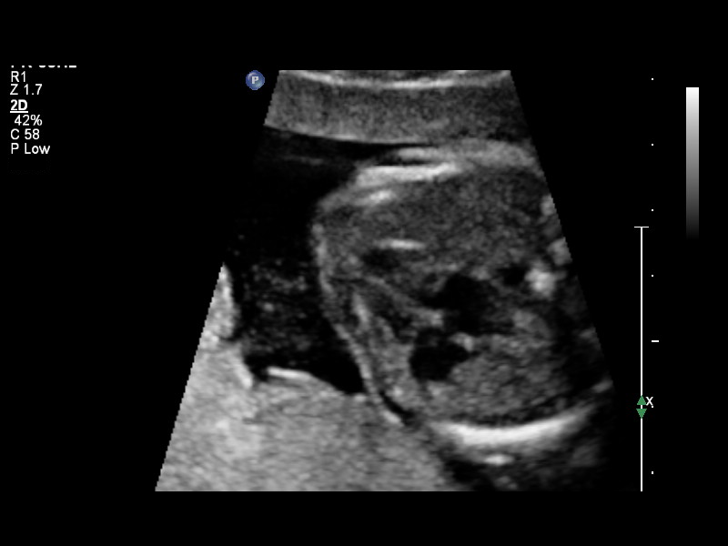
[im 22/49]
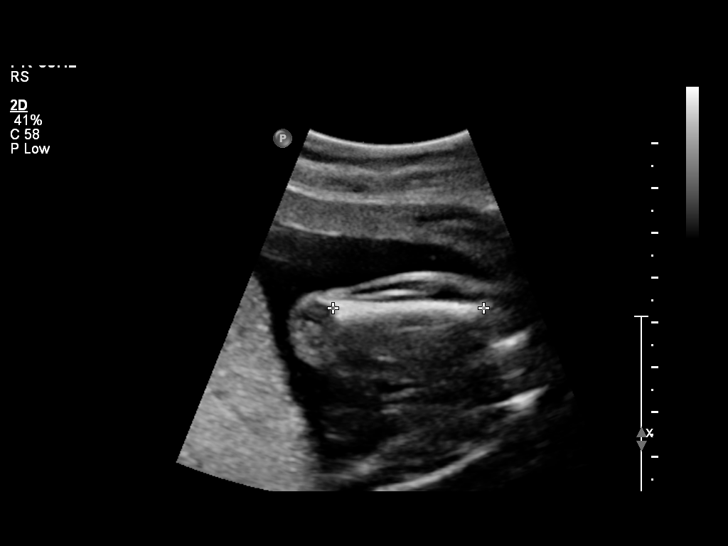
[im 27/49]
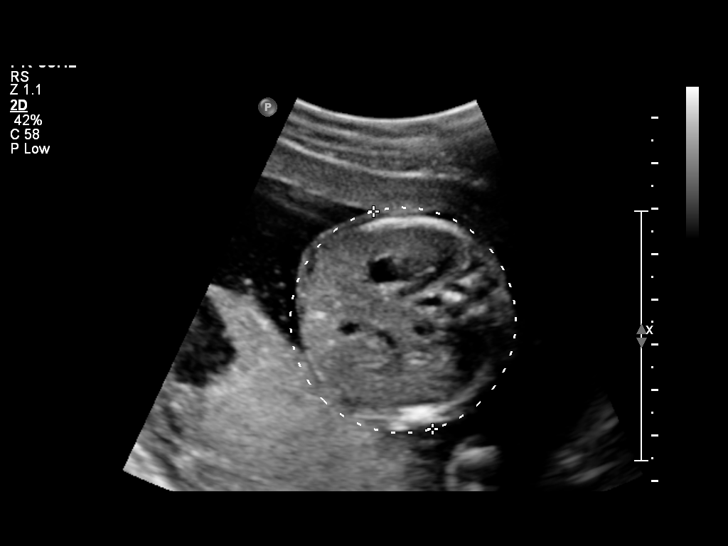
[im 31/49]
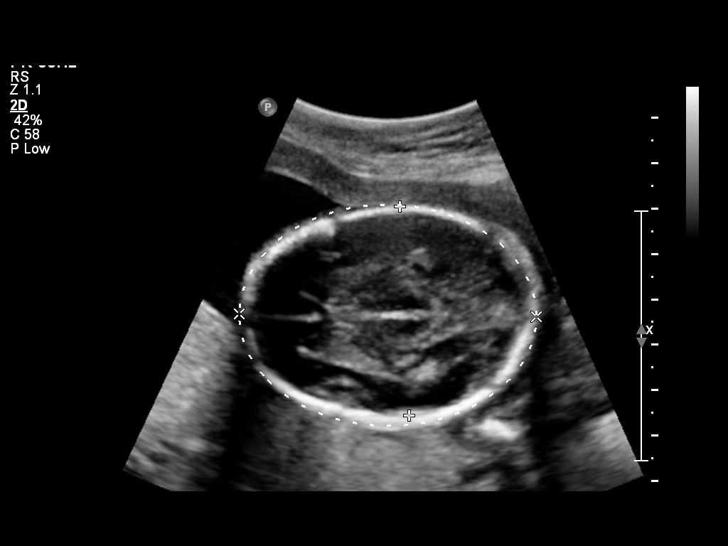
[im 34/49]
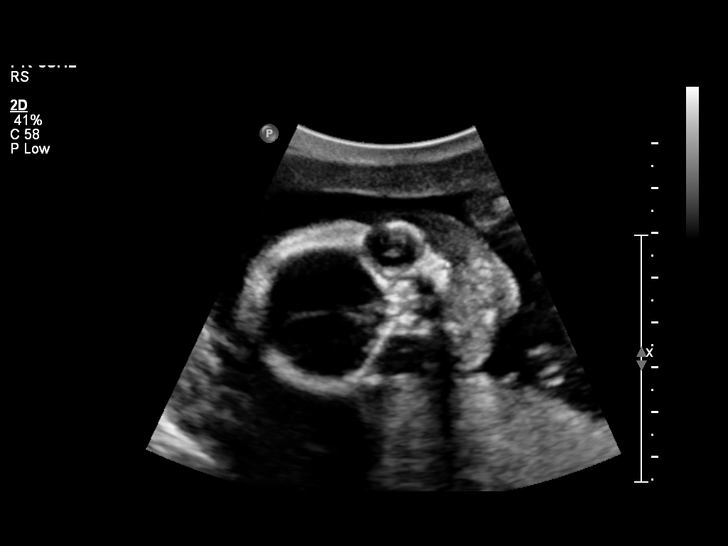
[im 40/49]
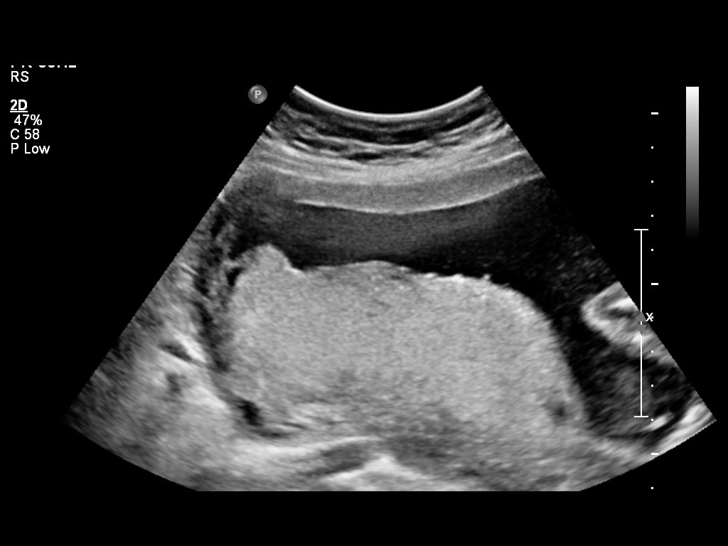
[im 43/49]
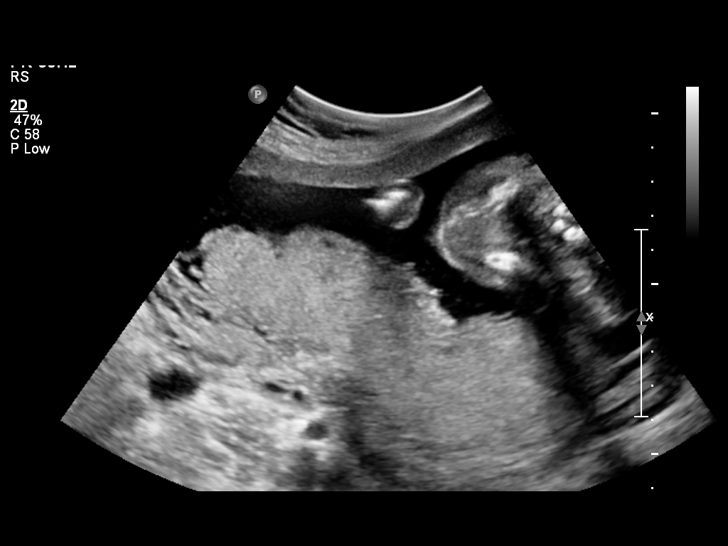
[im 47/49]
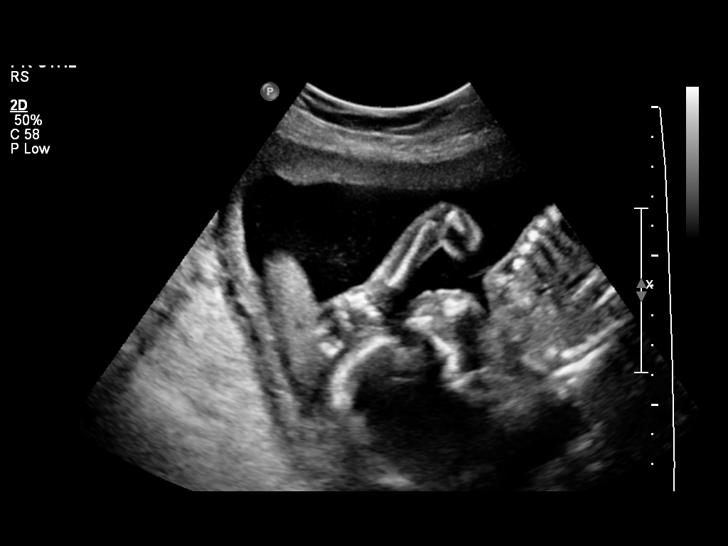

[12 of 28 positions shown; findings below may reference images not displayed]

OBSTETRICS REPORT
                      (Signed Final 02/25/2013 [DATE])

Service(s) Provided

 US OB FOLLOW UP                                       76816.1
Indications

 Evaluate anatomy not seen on prior sonogram
 Pyelectasis of fetus on prenatal ultrasound           655.83 593.89,

Fetal Evaluation

 Num Of Fetuses:    1
 Fetal Heart Rate:  150                         bpm
 Cardiac Activity:  Observed
 Presentation:      Breech
 Placenta:          Posterior, above cervical
                    os
 P. Cord            Previously Visualized
 Insertion:

 Amniotic Fluid
 AFI FV:      Subjectively within normal limits
                                             Larg Pckt:     5.8  cm
Biometry

 BPD:       46  mm    G. Age:   19w 6d                CI:        66.57   70 - 86
                                                      FL/HC:      18.1   15.9 -

 HC:     180.7  mm    G. Age:   20w 3d       25  %    HC/AC:      1.16   1.06 -

 AC:       156  mm    G. Age:   20w 6d       41  %    FL/BPD:
 FL:      32.7  mm    G. Age:   20w 1d       21  %    FL/AC:      21.0   20 - 24
 Est. FW:     357  gm    0 lb 13 oz      37  %
Gestational Age

 LMP:           19w 5d       Date:   10/10/12                 EDD:   07/17/13
 U/S Today:     20w 2d                                        EDD:   07/13/13
 Best:          20w 6d    Det. By:   U/S C R L (01/02/13)     EDD:   07/09/13
Anatomy

 Cranium:          Appears normal         Aortic Arch:      Appears normal
 Fetal Cavum:      Previously seen        Ductal Arch:      Appears normal
 Ventricles:       Appears normal         Diaphragm:        Previously seen
 Choroid Plexus:   Previously seen        Stomach:          Appears normal, left
                                                            sided
 Cerebellum:       Previously seen        Abdomen:          Appears normal
 Posterior Fossa:  Previously seen        Abdominal Wall:   Previously seen
 Nuchal Fold:      Previously seen        Cord Vessels:     Previously seen
 Face:             Appears normal         Kidneys:          Abnormal, see
                   (orbits and profile)
                                                            comments
 Lips:             Previously seen        Bladder:          Appears normal
 Heart:            Appears normal         Spine:            Previously seen
                   (4CH, axis, and
                   situs)
 RVOT:             Appears normal         Lower             Previously seen
                                          Extremities:
 LVOT:             Appears normal         Upper             Previously seen
                                          Extremities:

 Other:  Fetus appears to be a male. 5th digit previously visualized.
Cervix Uterus Adnexa

 Cervical Length:   3.67      cm

 Cervix:       Normal appearance by transabdominal scan.
 Uterus:       No abnormality visualized.
 Cul De Sac:   No free fluid seen.

 Left Ovary:   Not visualized.
 Right Ovary:  Not visualized.
 Adnexa:     No abnormality visualized.
Impression

 Single living IUP with assigned GA of 20w 6d. Appropriate
 fetal growth.
 Persistent, markedly dilated LEFT renal pelvis (1.3 cm AP
 diameter), with associated caliectasis, and no evidence of
 ureteral dilatation. Findings are suspicious for a congenital
 left UPJ obstruction.
 Mild RIGHT renal pelviectasis on today's exam (measures 5
 mm AP diameter). Negative for caliectasis on the right.
 Normal appearance of the fetal bladder.
 The fetal outflow tracts, arches, and face appear normal.
 Normal amniotic fluid volume and cervical length.
Recommendations

 Follow-up ultrasound for fetal renal pyelectasis at 34 wks GA,
 and earlier as indicated.

## 2015-04-15 ENCOUNTER — Emergency Department (HOSPITAL_BASED_OUTPATIENT_CLINIC_OR_DEPARTMENT_OTHER): Payer: Medicaid Other

## 2015-04-15 ENCOUNTER — Emergency Department (HOSPITAL_BASED_OUTPATIENT_CLINIC_OR_DEPARTMENT_OTHER)
Admission: EM | Admit: 2015-04-15 | Discharge: 2015-04-16 | Disposition: A | Discharge status: Home / Self Care | Payer: Medicaid Other | Attending: Emergency Medicine | Admitting: Emergency Medicine

## 2015-04-15 ENCOUNTER — Encounter (HOSPITAL_BASED_OUTPATIENT_CLINIC_OR_DEPARTMENT_OTHER): Payer: Self-pay | Admitting: Emergency Medicine

## 2015-04-15 DIAGNOSIS — Y9389 Activity, other specified: Secondary | ICD-10-CM | POA: Diagnosis not present

## 2015-04-15 DIAGNOSIS — S3991XA Unspecified injury of abdomen, initial encounter: Secondary | ICD-10-CM | POA: Diagnosis not present

## 2015-04-15 DIAGNOSIS — Y998 Other external cause status: Secondary | ICD-10-CM | POA: Insufficient documentation

## 2015-04-15 DIAGNOSIS — Z87891 Personal history of nicotine dependence: Secondary | ICD-10-CM | POA: Insufficient documentation

## 2015-04-15 DIAGNOSIS — Y92410 Unspecified street and highway as the place of occurrence of the external cause: Secondary | ICD-10-CM | POA: Insufficient documentation

## 2015-04-15 DIAGNOSIS — Z3202 Encounter for pregnancy test, result negative: Secondary | ICD-10-CM | POA: Insufficient documentation

## 2015-04-15 DIAGNOSIS — Z792 Long term (current) use of antibiotics: Secondary | ICD-10-CM | POA: Insufficient documentation

## 2015-04-15 DIAGNOSIS — S3992XA Unspecified injury of lower back, initial encounter: Secondary | ICD-10-CM | POA: Insufficient documentation

## 2015-04-15 DIAGNOSIS — S299XXA Unspecified injury of thorax, initial encounter: Secondary | ICD-10-CM | POA: Diagnosis not present

## 2015-04-15 HISTORY — DX: Acne, unspecified: L70.9

## 2015-04-15 LAB — PREGNANCY, URINE: Preg Test, Ur: NEGATIVE

## 2015-04-15 MED ORDER — NAPROXEN 250 MG PO TABS
500.0000 mg | ORAL_TABLET | Freq: Once | ORAL | Status: AC
  Administered 2015-04-15: 500 mg via ORAL
  Filled 2015-04-15: qty 2

## 2015-04-15 MED ORDER — NAPROXEN 250 MG PO TABS
ORAL_TABLET | ORAL | Status: AC
  Filled 2015-04-15: qty 2

## 2015-04-15 NOTE — ED Notes (Signed)
MD at bedside. 
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
## 2015-04-16 MED ORDER — HYDROCODONE-ACETAMINOPHEN 5-325 MG PO TABS: 1.0000 | ORAL_TABLET | Freq: Four times a day (QID) | ORAL | Status: AC | PRN

## 2015-04-16 NOTE — ED Notes (Signed)
Patient transported to X-ray 

## 2015-04-16 NOTE — Discharge Instructions (Signed)

## 2015-10-06 ENCOUNTER — Ambulatory Visit: Payer: Medicaid Other | Admitting: Obstetrics & Gynecology

## 2015-11-07 ENCOUNTER — Encounter: Payer: Self-pay | Admitting: Obstetrics and Gynecology

## 2015-11-07 ENCOUNTER — Other Ambulatory Visit (HOSPITAL_COMMUNITY)
Admission: RE | Admit: 2015-11-07 | Discharge: 2015-11-07 | Disposition: A | Discharge status: Home / Self Care | Payer: Medicaid Other | Source: Ambulatory Visit | Attending: Obstetrics and Gynecology | Admitting: Obstetrics and Gynecology

## 2015-11-07 ENCOUNTER — Ambulatory Visit (INDEPENDENT_AMBULATORY_CARE_PROVIDER_SITE_OTHER): Payer: Medicaid Other | Admitting: Obstetrics and Gynecology

## 2015-11-07 VITALS — BP 114/76 | HR 72 | Wt 155.5 lb

## 2015-11-07 DIAGNOSIS — Z30011 Encounter for initial prescription of contraceptive pills: Secondary | ICD-10-CM | POA: Diagnosis not present

## 2015-11-07 DIAGNOSIS — Z1151 Encounter for screening for human papillomavirus (HPV): Secondary | ICD-10-CM

## 2015-11-07 DIAGNOSIS — Z01419 Encounter for gynecological examination (general) (routine) without abnormal findings: Secondary | ICD-10-CM | POA: Diagnosis present

## 2015-11-07 DIAGNOSIS — Z113 Encounter for screening for infections with a predominantly sexual mode of transmission: Secondary | ICD-10-CM | POA: Diagnosis present

## 2015-11-07 DIAGNOSIS — Z30432 Encounter for removal of intrauterine contraceptive device: Secondary | ICD-10-CM | POA: Diagnosis not present

## 2015-11-07 DIAGNOSIS — Z124 Encounter for screening for malignant neoplasm of cervix: Secondary | ICD-10-CM

## 2015-11-07 LAB — HIV ANTIBODY (ROUTINE TESTING W REFLEX): HIV 1&2 Ab, 4th Generation: NONREACTIVE

## 2015-11-07 MED ORDER — LEVONORGEST-ETH ESTRAD 91-DAY 0.15-0.03 &0.01 MG PO TABS: 1.0000 | ORAL_TABLET | Freq: Every day | ORAL | Status: AC

## 2015-11-07 NOTE — Progress Notes (Signed)
Subjective:     Virginia Sawyer is a 40 y.o. female [phone] with BMI 25 who is here for a comprehensive physical exam. The patient reports monthly heavy period with Mirena IUD which was placed in 09/2013. She states that with her first IUD placed prior to her last pregnancy, she experienced a very light cycle. This one does not seem to work as well She desires to have it removed and started on OCP instead. She may return to have a new IUD inserted at a later time. Patient is sexually active. Patient is without any other complaints. She desires STD screen  Past Medical History  Diagnosis Date  . Allergy   . [place] measles   . Sinus infection   . Acne    Past Surgical History  Procedure Laterality Date  . Wisdom tooth extraction    . No past surgeries    . Lasix eye surgery      age 65  . Leep    . Colonoscopy  03/2014   Family History  Problem Relation Age of Onset  . Vision loss Maternal Grandmother     cataracts  . Heart disease Maternal Grandfather   . Alcohol abuse Neg Hx   . Arthritis Neg Hx   . Asthma Neg Hx   . Birth defects Neg Hx   . Cancer Neg Hx   . COPD Neg Hx   . Depression Neg Hx   . Diabetes Neg Hx   . Drug abuse Neg Hx   . Hearing loss Neg Hx   . Early death Neg Hx   . Hyperlipidemia Neg Hx   . Hypertension Neg Hx   . Kidney disease Neg Hx   . Mental illness Neg Hx   . Learning disabilities Neg Hx   . Mental retardation Neg Hx   . Miscarriages / Stillbirths Neg Hx   . Stroke Neg Hx      Social History   Social History  . Marital Status: Single    Spouse Name: N/A  . Number of Children: N/A  . Years of Education: N/A   Occupational History  . Not on file.   Social History Main Topics  . Smoking status: Former Smoker    Quit date: 10/18/2012  . Smokeless tobacco: Never Used     Comment: PT SMOKES 10 CIG. PER DAY, currently does not smoke due to pregnancy  . Alcohol Use: No     Comment: social/ denies during pregnancy  . Drug Use: No  .  Sexual Activity:    Partners: Male    Birth Control/ Protection: IUD     Comment: [employer] for contraception   Other Topics Concern  . Not on file   Social History Narrative   Health Maintenance  Topic Date Due  . INFLUENZA VACCINE  05/16/2015  . PAP SMEAR  08/30/2017  . TETANUS/TDAP  05/21/2023  . HIV Screening  Completed      Review of Systems Pertinent items are noted in HPI.   Objective:      GENERAL: Well-developed, well-nourished female in no acute distress.  HEENT: Normocephalic, atraumatic. Sclerae anicteric.  NECK: Supple. Normal thyroid.  LUNGS: Clear to auscultation bilaterally.  HEART: Regular rate and rhythm. BREASTS: Symmetric in size. No palpable masses or lymphadenopathy, skin changes, or nipple drainage. ABDOMEN: Soft, nontender, nondistended. No organomegaly. PELVIC: Normal external female genitalia. Vagina is pink and rugated.  Normal discharge. Normal appearing cervix. IUD strings visualized at the os. Uterus is normal  in size. No adnexal mass or tenderness. EXTREMITIES: No cyanosis, clubbing, or edema, 2+ distal pulses.    Assessment:    Healthy female exam.      Plan:    - pap smear collected  - IUD removal procedure  A speculum was placed in the vagina and IUD strings visualized at the os. The strings were grasped with a ring Forceps and the IUD was removed without difficulty  - Patient advised to perform self breast and vulva exam monthly. Patient informed that she needs her first mammogram starting at the age of 40 - STD screen ordered - Patient will be contacted with any abnormal results See After Visit Summary for Counseling Recommendations

## 2015-11-08 ENCOUNTER — Encounter: Payer: Self-pay | Admitting: *Deleted

## 2015-11-08 LAB — HEPATITIS C ANTIBODY: HCV Ab: NEGATIVE

## 2015-11-08 LAB — HEPATITIS B SURFACE ANTIGEN: Hepatitis B Surface Ag: NEGATIVE

## 2015-11-08 LAB — RPR

## 2015-11-10 LAB — CYTOLOGY - PAP

## 2015-11-23 ENCOUNTER — Encounter: Payer: Self-pay | Admitting: Obstetrics and Gynecology

## 2015-11-23 ENCOUNTER — Telehealth: Payer: Self-pay | Admitting: *Deleted

## 2015-11-23 NOTE — Telephone Encounter (Signed)
Pt left message requesting results of recent labs and Pap. I called pt and advised that all tests were negative or normal.  Pt voiced understanding.

## 2016-02-09 ENCOUNTER — Ambulatory Visit (INDEPENDENT_AMBULATORY_CARE_PROVIDER_SITE_OTHER): Payer: Medicaid Other | Admitting: Obstetrics and Gynecology

## 2016-02-09 ENCOUNTER — Encounter: Payer: Self-pay | Admitting: Obstetrics and Gynecology

## 2016-02-09 VITALS — BP 107/74 | HR 77 | Wt 156.3 lb

## 2016-02-09 DIAGNOSIS — Z30430 Encounter for insertion of intrauterine contraceptive device: Secondary | ICD-10-CM | POA: Diagnosis present

## 2016-02-09 MED ORDER — LEVONORGESTREL 18.6 MCG/DAY IU IUD
INTRAUTERINE_SYSTEM | Freq: Once | INTRAUTERINE | Status: AC
  Administered 2016-02-09: 1 via INTRAUTERINE

## 2016-02-09 NOTE — Addendum Note (Signed)
Addended by: Faythe CasaBELLAMY, JEANETTA M on: 02/09/2016 03:49 PM   Modules accepted: Orders

## 2016-02-09 NOTE — Progress Notes (Signed)
Patient ID: Virginia Sawyer, female   DOB: 17-May-1976, 40 y.o.   MRN: [id number] 40 yo presenting today for the medical management of heavy cycles with IUD. Patient had used the IUD in the past for several years and had her most recent one removed in 2014. She was managing her periods with birth control pills but is now ready to have an IUD inserted  Past Medical History  Diagnosis Date  . Allergy   . [place]   . Sinus infection   . Acne    Past Surgical History  Procedure Laterality Date  . Wisdom tooth extraction    . No past surgeries    . Lasix eye surgery      age 40  . Leep    . Colonoscopy  03/2014   Family History  Problem Relation Age of Onset  . Vision loss Maternal Grandmother     cataracts  . Heart disease Maternal Grandfather   . Alcohol abuse Neg Hx   . Arthritis Neg Hx   . Asthma Neg Hx   . Birth defects Neg Hx   . Cancer Neg Hx   . COPD Neg Hx   . Depression Neg Hx   . Diabetes Neg Hx   . Drug abuse Neg Hx   . Hearing loss Neg Hx   . Early death Neg Hx   . Hyperlipidemia Neg Hx   . Hypertension Neg Hx   . Kidney disease Neg Hx   . Mental illness Neg Hx   . Learning disabilities Neg Hx   . Mental retardation Neg Hx   . Miscarriages / Stillbirths Neg Hx   . Stroke Neg Hx    Social History  Substance Use Topics  . Smoking status: Former Smoker    Quit date: 10/18/2012  . Smokeless tobacco: Never Used     Comment: PT SMOKES 10 CIG. PER DAY, currently does not smoke due to pregnancy  . Alcohol Use: No     Comment: social/ denies during pregnancy   ROS See pertinent in HPI  Blood pressure 107/74, pulse 77, weight 156 lb 4.8 oz (70.897 kg), currently breastfeeding. GENERAL: Well-developed, well-nourished female in no acute distress.  ABDOMEN: Soft, nontender, nondistended.  PELVIC: Normal external female genitalia. Vagina is pink and rugated.  Normal discharge. Normal appearing cervix. Uterus is normal in size. No adnexal mass or  tenderness. EXTREMITIES: No cyanosis, clubbing, or edema, 2+ distal pulses.  A/P 40 yo here for IUD insertion IUD Procedure Note Patient identified, informed consent performed, signed copy in chart, time out was performed.  Urine pregnancy test negative.  Speculum placed in the vagina.  Cervix visualized.  Cleaned with Betadine x 2.  Grasped anteriorly with a single tooth tenaculum.  Uterus sounded to 8 cm.  Liletta IUD placed per manufacturer's recommendations.  Strings trimmed to 3 cm. Tenaculum was removed, good hemostasis noted.  Patient tolerated procedure well.   Patient given post procedure instructions and Mirena care card with expiration date.  Patient is asked to check IUD strings periodically and follow up in 4-6 weeks for IUD check.

## 2016-02-10 LAB — POCT PREGNANCY, URINE: Preg Test, Ur: NEGATIVE

## 2016-03-08 ENCOUNTER — Ambulatory Visit (INDEPENDENT_AMBULATORY_CARE_PROVIDER_SITE_OTHER): Payer: Medicaid Other | Admitting: Obstetrics and Gynecology

## 2016-03-08 ENCOUNTER — Encounter: Payer: Self-pay | Admitting: Obstetrics and Gynecology

## 2016-03-08 VITALS — BP 112/76 | HR 76 | Ht 65.5 in | Wt 161.0 lb

## 2016-03-08 DIAGNOSIS — Z30431 Encounter for routine checking of intrauterine contraceptive device: Secondary | ICD-10-CM | POA: Diagnosis not present

## 2016-03-08 NOTE — Progress Notes (Signed)
Patient ID: Virginia Sawyer, female   DOB: 02-Mar-1976, 40 y.o.   MRN: [id number] 40 yo [id number] presenting today for strings check. Patient had IUD inserted in April 2017. She reports a normal light period without dysmenorrhea. She has been sexually active without complaints. She denies abdominal/pelvic pain  Past Medical History  Diagnosis Date  . Allergy   . [place]   . Sinus infection   . Acne    Past Surgical History  Procedure Laterality Date  . Wisdom tooth extraction    . No past surgeries    . Lasix eye surgery      age 40  . Leep    . Colonoscopy  03/2014   Family History  Problem Relation Age of Onset  . Vision loss Maternal Grandmother     cataracts  . Heart disease Maternal Grandfather   . Alcohol abuse Neg Hx   . Arthritis Neg Hx   . Asthma Neg Hx   . Birth defects Neg Hx   . Cancer Neg Hx   . COPD Neg Hx   . Depression Neg Hx   . Diabetes Neg Hx   . Drug abuse Neg Hx   . Hearing loss Neg Hx   . Early death Neg Hx   . Hyperlipidemia Neg Hx   . Hypertension Neg Hx   . Kidney disease Neg Hx   . Mental illness Neg Hx   . Learning disabilities Neg Hx   . Mental retardation Neg Hx   . Miscarriages / Stillbirths Neg Hx   . Stroke Neg Hx    Social History  Substance Use Topics  . Smoking status: Former Smoker    Quit date: 10/18/2012  . Smokeless tobacco: Never Used     Comment: PT SMOKES 10 CIG. PER DAY, currently does not smoke due to pregnancy  . Alcohol Use: No     Comment: social/ denies during pregnancy   ROS See pertinent in HPI  Blood pressure 112/76, pulse 76, height 5' 5.5" (1.664 m), weight 161 lb (73.029 kg), currently breastfeeding. GENERAL: Well-developed, well-nourished female in no acute distress.  ABDOMEN: Soft, nontender, nondistended.  PELVIC: Normal external female genitalia. Vagina is pink and rugated.  Normal discharge. Normal appearing cervix with IUD strings extending 2.5 cm from the external os. Uterus is normal in size.  No adnexal mass or tenderness. EXTREMITIES: No cyanosis, clubbing, or edema, 2+ distal pulses.   A/P 40 yo here for IUD check - IUD appears to be in the appropriate location - patient plans to have her rectocele re-evaluated in June for possible surgical intervention. Answered multiple questions - RTC prn

## 2016-06-15 ENCOUNTER — Other Ambulatory Visit: Payer: Self-pay | Admitting: Family Medicine

## 2016-06-15 DIAGNOSIS — Z1231 Encounter for screening mammogram for malignant neoplasm of breast: Secondary | ICD-10-CM

## 2016-07-04 ENCOUNTER — Telehealth: Payer: Self-pay | Admitting: *Deleted

## 2016-07-04 NOTE — Telephone Encounter (Signed)
Attempted to reach patient but there was no answer or voicemail to leave a message. Patient was seen in our office 10/2015

## 2016-07-04 NOTE — Telephone Encounter (Signed)
Pt left message yesterday stating that she has a lymph node which is swollen. She wants to schedule appt for Pap and to pay cash out of pocket for the appt. She asked how much it will cost.

## 2016-07-09 NOTE — Telephone Encounter (Signed)
I have spoken with the patient in regards to a pap smear. Patient will come in for STD testing.

## 2016-07-30 ENCOUNTER — Ambulatory Visit: Payer: Medicaid Other

## 2016-08-07 ENCOUNTER — Ambulatory Visit
Admission: RE | Admit: 2016-08-07 | Discharge: 2016-08-07 | Disposition: A | Discharge status: Home / Self Care | Payer: Medicaid Other | Source: Ambulatory Visit | Attending: Family Medicine | Admitting: Family Medicine

## 2016-08-07 DIAGNOSIS — Z1231 Encounter for screening mammogram for malignant neoplasm of breast: Secondary | ICD-10-CM
